# Patient Record
Sex: Male | Born: 1945 | Race: White | Hispanic: No | Marital: Married | Smoking: Current every day smoker
Health system: Southern US, Community
[De-identification: ages and names within clinical notes are randomized; demographics above are authoritative.]

## PROBLEM LIST (undated history)

## (undated) DIAGNOSIS — F319 Bipolar disorder, unspecified: Secondary | ICD-10-CM

## (undated) DIAGNOSIS — K219 Gastro-esophageal reflux disease without esophagitis: Secondary | ICD-10-CM

## (undated) DIAGNOSIS — J449 Chronic obstructive pulmonary disease, unspecified: Secondary | ICD-10-CM

## (undated) DIAGNOSIS — I10 Essential (primary) hypertension: Secondary | ICD-10-CM

## (undated) DIAGNOSIS — M1990 Unspecified osteoarthritis, unspecified site: Secondary | ICD-10-CM

## (undated) DIAGNOSIS — R011 Cardiac murmur, unspecified: Secondary | ICD-10-CM

## (undated) HISTORY — PX: HIP SURGERY: SHX245

---

## 2004-09-28 ENCOUNTER — Inpatient Hospital Stay (HOSPITAL_COMMUNITY): Admission: EM | Admit: 2004-09-28 | Discharge: 2004-09-30 | Payer: Self-pay | Admitting: Emergency Medicine

## 2004-09-28 ENCOUNTER — Ambulatory Visit: Payer: Self-pay | Admitting: Family Medicine

## 2006-04-02 ENCOUNTER — Emergency Department (HOSPITAL_COMMUNITY): Admission: EM | Admit: 2006-04-02 | Discharge: 2006-04-02 | Payer: Self-pay | Admitting: Emergency Medicine

## 2017-01-22 ENCOUNTER — Emergency Department (HOSPITAL_COMMUNITY): Payer: Medicare Other

## 2017-01-22 ENCOUNTER — Inpatient Hospital Stay (HOSPITAL_COMMUNITY): Payer: Medicare Other

## 2017-01-22 ENCOUNTER — Inpatient Hospital Stay (HOSPITAL_COMMUNITY)
Admission: EM | Admit: 2017-01-22 | Discharge: 2017-01-25 | DRG: 389 | Disposition: A | Discharge status: Home / Self Care | Payer: Medicare Other | Attending: Internal Medicine | Admitting: Internal Medicine

## 2017-01-22 ENCOUNTER — Encounter (HOSPITAL_COMMUNITY): Payer: Self-pay | Admitting: Emergency Medicine

## 2017-01-22 DIAGNOSIS — R509 Fever, unspecified: Secondary | ICD-10-CM

## 2017-01-22 DIAGNOSIS — I10 Essential (primary) hypertension: Secondary | ICD-10-CM

## 2017-01-22 DIAGNOSIS — K56600 Partial intestinal obstruction, unspecified as to cause: Principal | ICD-10-CM | POA: Diagnosis present

## 2017-01-22 DIAGNOSIS — E875 Hyperkalemia: Secondary | ICD-10-CM | POA: Diagnosis present

## 2017-01-22 DIAGNOSIS — E869 Volume depletion, unspecified: Secondary | ICD-10-CM | POA: Diagnosis present

## 2017-01-22 DIAGNOSIS — R739 Hyperglycemia, unspecified: Secondary | ICD-10-CM | POA: Diagnosis present

## 2017-01-22 DIAGNOSIS — T39395A Adverse effect of other nonsteroidal anti-inflammatory drugs [NSAID], initial encounter: Secondary | ICD-10-CM | POA: Diagnosis present

## 2017-01-22 DIAGNOSIS — T445X5A Adverse effect of predominantly beta-adrenoreceptor agonists, initial encounter: Secondary | ICD-10-CM | POA: Diagnosis present

## 2017-01-22 DIAGNOSIS — N179 Acute kidney failure, unspecified: Secondary | ICD-10-CM | POA: Diagnosis present

## 2017-01-22 DIAGNOSIS — R109 Unspecified abdominal pain: Secondary | ICD-10-CM

## 2017-01-22 DIAGNOSIS — Z720 Tobacco use: Secondary | ICD-10-CM | POA: Diagnosis present

## 2017-01-22 DIAGNOSIS — E860 Dehydration: Secondary | ICD-10-CM | POA: Diagnosis present

## 2017-01-22 DIAGNOSIS — Z79899 Other long term (current) drug therapy: Secondary | ICD-10-CM

## 2017-01-22 DIAGNOSIS — Z5320 Procedure and treatment not carried out because of patient's decision for unspecified reasons: Secondary | ICD-10-CM | POA: Diagnosis present

## 2017-01-22 DIAGNOSIS — F17200 Nicotine dependence, unspecified, uncomplicated: Secondary | ICD-10-CM | POA: Diagnosis present

## 2017-01-22 DIAGNOSIS — K56609 Unspecified intestinal obstruction, unspecified as to partial versus complete obstruction: Secondary | ICD-10-CM | POA: Diagnosis present

## 2017-01-22 HISTORY — DX: Essential (primary) hypertension: I10

## 2017-01-22 LAB — CBC
HCT: 43.3 % (ref 39.0–52.0)
Hemoglobin: 14.9 g/dL (ref 13.0–17.0)
MCH: 33.8 pg (ref 26.0–34.0)
MCHC: 34.4 g/dL (ref 30.0–36.0)
MCV: 98.2 fL (ref 78.0–100.0)
Platelets: 222 10*3/uL (ref 150–400)
RBC: 4.41 MIL/uL (ref 4.22–5.81)
RDW: 12.9 % (ref 11.5–15.5)
WBC: 9.4 10*3/uL (ref 4.0–10.5)

## 2017-01-22 LAB — URINALYSIS, ROUTINE W REFLEX MICROSCOPIC
Bacteria, UA: NONE SEEN
Glucose, UA: NEGATIVE mg/dL
Hgb urine dipstick: NEGATIVE
Ketones, ur: NEGATIVE mg/dL
Leukocytes, UA: NEGATIVE
Nitrite: NEGATIVE
Protein, ur: 30 mg/dL — AB
Specific Gravity, Urine: 1.026 (ref 1.005–1.030)
pH: 5 (ref 5.0–8.0)

## 2017-01-22 LAB — LIPASE, BLOOD: Lipase: 25 U/L (ref 11–51)

## 2017-01-22 LAB — DIFFERENTIAL
Basophils Absolute: 0 10*3/uL (ref 0.0–0.1)
Basophils Relative: 0 %
Eosinophils Absolute: 0.1 10*3/uL (ref 0.0–0.7)
Eosinophils Relative: 1 %
Lymphocytes Relative: 15 %
Lymphs Abs: 1.3 10*3/uL (ref 0.7–4.0)
Monocytes Absolute: 1.2 10*3/uL — ABNORMAL HIGH (ref 0.1–1.0)
Monocytes Relative: 13 %
Neutro Abs: 6.2 10*3/uL (ref 1.7–7.7)
Neutrophils Relative %: 71 %

## 2017-01-22 LAB — COMPREHENSIVE METABOLIC PANEL
ALT: 24 U/L (ref 17–63)
AST: 39 U/L (ref 15–41)
Albumin: 4.7 g/dL (ref 3.5–5.0)
Alkaline Phosphatase: 59 U/L (ref 38–126)
Anion gap: 13 (ref 5–15)
BUN: 44 mg/dL — ABNORMAL HIGH (ref 6–20)
CO2: 23 mmol/L (ref 22–32)
Calcium: 8.9 mg/dL (ref 8.9–10.3)
Chloride: 101 mmol/L (ref 101–111)
Creatinine, Ser: 2.02 mg/dL — ABNORMAL HIGH (ref 0.61–1.24)
GFR calc Af Amer: 37 mL/min — ABNORMAL LOW (ref >60)
GFR calc non Af Amer: 32 mL/min — ABNORMAL LOW (ref >60)
Glucose, Bld: 127 mg/dL — ABNORMAL HIGH (ref 65–99)
Potassium: 5.6 mmol/L — ABNORMAL HIGH (ref 3.5–5.1)
Sodium: 137 mmol/L (ref 135–145)
Total Bilirubin: 1.7 mg/dL — ABNORMAL HIGH (ref 0.3–1.2)
Total Protein: 7.9 g/dL (ref 6.5–8.1)

## 2017-01-22 LAB — LACTIC ACID, PLASMA: Lactic Acid, Venous: 0.7 mmol/L (ref 0.5–1.9)

## 2017-01-22 LAB — I-STAT CG4 LACTIC ACID, ED: Lactic Acid, Venous: 3.16 mmol/L (ref 0.5–1.9)

## 2017-01-22 MED ORDER — LABETALOL HCL 5 MG/ML IV SOLN
10.0000 mg | INTRAVENOUS | Status: DC | PRN
  Administered 2017-01-23: 10 mg via INTRAVENOUS
  Filled 2017-01-22: qty 4

## 2017-01-22 MED ORDER — HYDROMORPHONE HCL 1 MG/ML IJ SOLN
1.0000 mg | INTRAMUSCULAR | Status: DC | PRN
  Administered 2017-01-22 – 2017-01-23 (×4): 1 mg via INTRAVENOUS
  Filled 2017-01-22 (×4): qty 1

## 2017-01-22 MED ORDER — GABAPENTIN 400 MG PO CAPS
800.0000 mg | ORAL_CAPSULE | Freq: Every day | ORAL | Status: DC
  Administered 2017-01-23 – 2017-01-25 (×3): 800 mg via ORAL
  Filled 2017-01-22 (×3): qty 2

## 2017-01-22 MED ORDER — BENZOCAINE 20 % MT AERO
INHALATION_SPRAY | OROMUCOSAL | Status: AC
  Filled 2017-01-22: qty 57

## 2017-01-22 MED ORDER — HYDROMORPHONE HCL 1 MG/ML IJ SOLN
1.0000 mg | Freq: Once | INTRAMUSCULAR | Status: AC
  Administered 2017-01-22: 1 mg via INTRAVENOUS
  Filled 2017-01-22: qty 1

## 2017-01-22 MED ORDER — SODIUM CHLORIDE 0.9 % IV BOLUS (SEPSIS)
1000.0000 mL | Freq: Once | INTRAVENOUS | Status: AC
  Administered 2017-01-22: 1000 mL via INTRAVENOUS

## 2017-01-22 MED ORDER — IOPAMIDOL (ISOVUE-300) INJECTION 61%
INTRAVENOUS | Status: AC
  Filled 2017-01-22: qty 30

## 2017-01-22 MED ORDER — ONDANSETRON HCL 4 MG/2ML IJ SOLN: 4.0000 mg | Freq: Four times a day (QID) | INTRAMUSCULAR | Status: DC | PRN

## 2017-01-22 MED ORDER — ONDANSETRON HCL 4 MG PO TABS: 4.0000 mg | ORAL_TABLET | Freq: Four times a day (QID) | ORAL | Status: DC | PRN

## 2017-01-22 MED ORDER — SODIUM CHLORIDE 0.9 % IV SOLN: Freq: Once | INTRAVENOUS | Status: DC

## 2017-01-22 MED ORDER — KCL IN DEXTROSE-NACL 20-5-0.9 MEQ/L-%-% IV SOLN
INTRAVENOUS | Status: DC
  Administered 2017-01-22 – 2017-01-24 (×4): via INTRAVENOUS

## 2017-01-22 MED ORDER — PANTOPRAZOLE SODIUM 40 MG PO TBEC
40.0000 mg | DELAYED_RELEASE_TABLET | Freq: Every day | ORAL | Status: DC
  Administered 2017-01-23 – 2017-01-25 (×3): 40 mg via ORAL
  Filled 2017-01-22 (×3): qty 1

## 2017-01-22 MED ORDER — QUETIAPINE FUMARATE 100 MG PO TABS
200.0000 mg | ORAL_TABLET | Freq: Every day | ORAL | Status: DC
Start: 2017-01-22 — End: 2017-01-25
  Administered 2017-01-22 – 2017-01-23 (×2): 200 mg via ORAL
  Administered 2017-01-24: 100 mg via ORAL
  Filled 2017-01-22 (×3): qty 2

## 2017-01-22 MED ORDER — ONDANSETRON HCL 4 MG/2ML IJ SOLN
4.0000 mg | Freq: Once | INTRAMUSCULAR | Status: AC
  Administered 2017-01-22: 4 mg via INTRAVENOUS
  Filled 2017-01-22: qty 2

## 2017-01-22 MED ORDER — AMLODIPINE BESYLATE 5 MG PO TABS
10.0000 mg | ORAL_TABLET | Freq: Every day | ORAL | Status: DC
  Administered 2017-01-23 – 2017-01-25 (×3): 10 mg via ORAL
  Filled 2017-01-22 (×3): qty 2

## 2017-01-22 MED ORDER — HEPARIN SODIUM (PORCINE) 5000 UNIT/ML IJ SOLN
5000.0000 [IU] | Freq: Three times a day (TID) | INTRAMUSCULAR | Status: DC
  Administered 2017-01-22 – 2017-01-25 (×7): 5000 [IU] via SUBCUTANEOUS
  Filled 2017-01-22 (×9): qty 1

## 2017-01-22 NOTE — H&P (Signed)
History and Physical    Gabriel Henderson WUJ:[id number] DOB: 05/10/1946 DOA: 01/22/2017  PCP: No PCP Per Patient  Patient coming from: Home.    Chief Complaint:  Abdominal pain.   HPI: Gabriel Henderson is an 70 y.o. 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 Work up in the ER showed elevated BUN/Cr of 44 and 2.2, normal WBC, normal electrolytes, normal LFT and normal lipase.  An abdominal pelvic CT showed SBO with transitional point.  He denied ever diagnosed with IBD and specifically never had crohn's disease.  He had a colonsocpy last year, and reportedly was negative.  EDP spoke with Dr Lovell Sheehan, and hospitalist was asked to admit him for SBO.  He has a "virgin belly".    ED Course:  See above.  Rewiew of Systems:  Constitutional: Negative for malaise, fever and chills. No significant weight loss or weight gain Eyes: Negative for eye pain, redness and discharge, diplopia, visual changes, or flashes of light. ENMT: Negative for ear pain, hoarseness, nasal congestion, sinus pressure and sore throat. No headaches; tinnitus, drooling, or problem swallowing. Cardiovascular: Negative for chest pain, palpitations, diaphoresis, dyspnea and peripheral edema. ; No orthopnea, PND Respiratory: Negative for cough, hemoptysis, wheezing and stridor. No pleuritic chestpain. Gastrointestinal: Negative for diarrhea, constipation,  melena, blood in stool, hematemesis, jaundice and rectal bleeding.    Genitourinary: Negative for frequency, dysuria, incontinence,flank pain and hematuria; Musculoskeletal: Negative for back pain and neck pain. Negative for swelling and trauma.;  Skin: . Negative for pruritus, rash, abrasions, bruising and skin lesion.; ulcerations Neuro: Negative for headache, lightheadedness and neck stiffness. Negative for weakness, altered level of consciousness , altered mental status,  extremity weakness, burning feet, involuntary movement, seizure and syncope.  Psych: negative for anxiety, depression, insomnia, tearfulness, panic attacks, hallucinations, paranoia, suicidal or homicidal ideation    Past Medical History:  Diagnosis Date  . Hypertension    History reviewed. No pertinent surgical history.   reports that he has been smoking.  He does not have any smokeless tobacco history on file. He reports that he does not drink alcohol or use drugs.  No Known Allergies  History reviewed. No pertinent family history.   Prior to Admission medications   Medication Sig Start Date End Date Taking? Authorizing Provider  amLODipine (NORVASC) 10 MG tablet Take 10 mg by mouth daily.   Yes Historical Provider, MD  diclofenac (CATAFLAM) 50 MG tablet Take 50 mg by mouth 2 (two) times daily.   Yes Historical Provider, MD  gabapentin (NEURONTIN) 400 MG capsule Take 800 mg by mouth daily.   Yes Historical Provider, MD  lisinopril (PRINIVIL,ZESTRIL) 20 MG tablet Take 10 mg by mouth daily.   Yes Historical Provider, MD  omeprazole (PRILOSEC) 20 MG capsule Take 40 mg by mouth 2 (two) times daily before a meal.   Yes Historical Provider, MD  QUEtiapine (SEROQUEL) 200 MG tablet Take 200 mg by mouth at bedtime.   Yes Historical Provider, MD  sulfaSALAzine (AZULFIDINE) 500 MG EC tablet Take 500 mg by mouth 2 (two) times daily.   Yes Historical Provider, MD    Physical Exam: Vitals:   01/22/17 1516 01/22/17 1517 01/22/17 1606 01/22/17 1630  BP: (!) 151/115  144/77 130/78  Pulse: 104  106 102  Resp: 20   18  Temp: 98 F (36.7 C)     TempSrc: Oral  SpO2: 98%  96% 93%  Weight:  74.8 kg (165 lb)    Height:  6\' 3"  (1.905 m)        Constitutional: NAD, calm, comfortable Vitals:   01/22/17 1516 01/22/17 1517 01/22/17 1606 01/22/17 1630  BP: (!) 151/115  144/77 130/78  Pulse: 104  106 102  Resp: 20   18  Temp: 98 F (36.7 C)     TempSrc: Oral     SpO2: 98%  96% 93%    Weight:  74.8 kg (165 lb)    Height:  6\' 3"  (1.905 m)     Eyes: PERRL, lids and conjunctivae normal ENMT: Mucous membranes are moist. Posterior pharynx clear of any exudate or lesions.Normal dentition.  Neck: normal, supple, no masses, no thyromegaly Respiratory: clear to auscultation bilaterally, no wheezing, no crackles. Normal respiratory effort. No accessory muscle use.  Cardiovascular: Regular rate and rhythm, no murmurs / rubs / gallops. No extremity edema. 2+ pedal pulses. No carotid bruits.  Abdomen: diffuse tenderness, no rebound. Slightly distended with hypertympay. no masses palpated. No hepatosplenomegaly. Bowel sounds positive.  Musculoskeletal: no clubbing / cyanosis. No joint deformity upper and lower extremities. Good ROM, no contractures. Normal muscle tone.  Skin: no rashes, lesions, ulcers. No induration Neurologic: CN 2-12 grossly intact. Sensation intact, DTR normal. Strength 5/5 in all 4.  Psychiatric: Normal judgment and insight. Alert and oriented x 3. Normal mood.     Labs on Admission: I have personally reviewed following labs and imaging studies  CBC:  Recent Labs Lab 01/22/17 1555  WBC 9.4  NEUTROABS 6.2  HGB 14.9  HCT 43.3  MCV 98.2  PLT 222   Basic Metabolic Panel:  Recent Labs Lab 01/22/17 1555  NA 137  K 5.6*  CL 101  CO2 23  GLUCOSE 127*  BUN 44*  CREATININE 2.02*  CALCIUM 8.9   GFR: Estimated Creatinine Clearance: 37 mL/min (by C-G formula based on SCr of 2.02 mg/dL (H)). Liver Function Tests:  Recent Labs Lab 01/22/17 1555  AST 39  ALT 24  ALKPHOS 59  BILITOT 1.7*  PROT 7.9  ALBUMIN 4.7    Recent Labs Lab 01/22/17 1555  LIPASE 25   Urine analysis:    Component Value Date/Time   COLORURINE AMBER (A) 01/22/2017 1715   APPEARANCEUR HAZY (A) 01/22/2017 1715   LABSPEC 1.026 01/22/2017 1715   PHURINE 5.0 01/22/2017 1715   GLUCOSEU NEGATIVE 01/22/2017 1715   HGBUR NEGATIVE 01/22/2017 1715   BILIRUBINUR SMALL (A)  01/22/2017 1715   KETONESUR NEGATIVE 01/22/2017 1715   PROTEINUR 30 (A) 01/22/2017 1715   NITRITE NEGATIVE 01/22/2017 1715   LEUKOCYTESUR NEGATIVE 01/22/2017 1715    Radiological Exams on Admission: Ct Abdomen Pelvis Wo Contrast  Result Date: 01/22/2017 CLINICAL DATA:  Abdominal pain.  Nausea and vomiting. EXAM: CT ABDOMEN AND PELVIS WITHOUT CONTRAST TECHNIQUE: Multidetector CT imaging of the abdomen and pelvis was performed following the standard protocol without IV contrast. COMPARISON:  September 28, 2004 FINDINGS: Lower chest: Dependent atelectasis. Coronary artery calcifications. No other abnormalities. Hepatobiliary: No focal liver abnormality is seen. No gallstones, gallbladder wall thickening, or biliary dilatation. Pancreas: Unremarkable. No pancreatic ductal dilatation or surrounding inflammatory changes. Spleen: Normal in size without focal abnormality. Adrenals/Urinary Tract: Adrenal glands are unremarkable. Kidneys are normal, without renal calculi, focal lesion, or hydronephrosis. Bladder is unremarkable. Stomach/Bowel: The stomach is normal. There is a small bowel obstruction. The transition point is thought to be in the right lateral lower abdomen in the  region of mid to distal ileum. 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 The colon is not completely decompressed but is smaller than small bowel. Visualized appendix is unremarkable. Vascular/Lymphatic: Calcified atherosclerosis is seen in the non aneurysmal aorta and branching vessels. No adenopathy. Reproductive: Prostate is unremarkable. Other: No abdominal wall hernia or abnormality. No abdominopelvic ascites. Musculoskeletal: No acute or significant osseous findings. IMPRESSION: 1. Small bowel obstruction. The transition point is thought to be in the mid to distal ileum within the lateral right lower abdomen. 2. Atherosclerosis. Electronically Signed   By: Gerome Sam III M.D   On: 01/22/2017 18:55   Dg Abd  Portable 1 View  Result Date: 01/22/2017 CLINICAL DATA:  Abdominal pain.  Nausea and vomiting. EXAM: PORTABLE ABDOMEN - 1 VIEW COMPARISON:  09/28/2004 CT abdomen/pelvis. FINDINGS: Motion degraded radiograph. There are several dilated small bowel loops in the visualized upper abdomen measuring up to 4.1 cm diameter. No evidence of pneumatosis or pneumoperitoneum. Grossly clear lung bases. No pathologic soft tissue calcifications. IMPRESSION: Limited motion degraded radiograph. No free air. Dilated small bowel loops in the visualized upper abdomen, cannot exclude a mid / distal small bowel obstruction. Correlate with CT abdomen/pelvis with oral IV contrast as clinically warranted. Electronically Signed   By: Delbert Phenix M.D.   On: 01/22/2017 16:34    Assessment/Plan Principal Problem:   SBO (small bowel obstruction) Active Problems:   AKI (acute kidney injury) (HCC)   Volume depletion   Tobacco abuse   HTN (hypertension)    PLAN:     SBO:  Will benefit having NGT placed.  Make NPO except for ice chips and water with meds.  Await further recommendation from surgery.  IV Dilaudid.  Hold Suboxone for now.   AKI:  Suspect volume depletion.  Will give IVF.  Hold ACE I due to elevated Cr, to increase GFR.  Follow Cr in the am.  Tobacco abuse:  Advised quit.  HTN:  Will continue norvasc.  Can use IV Labetelol PRN for severe HTN.   Hold ACE due to AKI.   DVT prophylaxis: Sub Q Heparin.  Code Status: FULL CODE.  Family Communication: wife at bedside.  Disposition Plan: home when better.  Consults called: Surgery, Dr Lovell Sheehan.  Admission status: Inpatient.    Suha Schoenbeck MD FACP. [facility]  If 7PM-7AM, please contact night-coverage [email or web address] Password [facility]  01/22/2017, 8:23 PM

## 2017-01-22 NOTE — ED Provider Notes (Signed)
[facility]-EMERGENCY DEPT Provider Note   CSN: [id number] Arrival date & time: 01/22/17  1511     History   Chief Complaint Chief Complaint  Patient presents with  . Abdominal Pain    HPI Gabriel Henderson is a 71 y.o. male.  HPI 71 year old male who presents with abdominal pain. History of HTN. No prior abdominal surgeries. Onset of gradually worsening abdominal pain one day ago. Pain generalized, 6/10 in severity currently. Associated with multiple episodes of nausea and vomiting, nonbloody and nonbilious. Normal BM yesterday. No fever, diarrhea, dysuria, urinary frequency, chest pain or dyspnea. Seen at urgent care this morning and sent to ED for evaluation. Past Medical History:  Diagnosis Date  . Hypertension     There are no active problems to display for this patient.   History reviewed. No pertinent surgical history.     Home Medications    Prior to Admission medications   Medication Sig Start Date End Date Taking? Authorizing Provider  amLODipine (NORVASC) 10 MG tablet Take 10 mg by mouth daily.   Yes Historical Provider, MD  diclofenac (CATAFLAM) 50 MG tablet Take 50 mg by mouth 2 (two) times daily.   Yes Historical Provider, MD  gabapentin (NEURONTIN) 400 MG capsule Take 800 mg by mouth daily.   Yes Historical Provider, MD  lisinopril (PRINIVIL,ZESTRIL) 20 MG tablet Take 10 mg by mouth daily.   Yes Historical Provider, MD  omeprazole (PRILOSEC) 20 MG capsule Take 40 mg by mouth 2 (two) times daily before a meal.   Yes Historical Provider, MD  QUEtiapine (SEROQUEL) 200 MG tablet Take 200 mg by mouth at bedtime.   Yes Historical Provider, MD  sulfaSALAzine (AZULFIDINE) 500 MG EC tablet Take 500 mg by mouth 2 (two) times daily.   Yes Historical Provider, MD    Family History History reviewed. No pertinent family history.  Social History Social History  Substance Use Topics  . Smoking status: Current Some Day Smoker  . Smokeless tobacco: Not on file  . Alcohol use  No     Allergies   Patient has no known allergies.   Review of Systems Review of Systems 10/14 systems reviewed and are negative other than those stated in the HPI   Physical Exam Updated Vital Signs BP 130/78   Pulse 102   Temp 98 F (36.7 C) (Oral)   Resp 18   Ht 6\' 3"  (1.905 m)   Wt 165 lb (74.8 kg)   SpO2 93%   BMI 20.62 kg/m   Physical Exam Physical Exam  Nursing note and vitals reviewed. Constitutional: looks unwell, in distress secondary to pain  Head: Normocephalic and atraumatic.  Mouth/Throat: Oropharynx is clear and moist.  Neck: Normal range of motion. Neck supple.  Cardiovascular: tachycardic rate and regular rhythm.   Pulmonary/Chest: Effort normal and breath sounds normal.  Abdominal: Soft. Moderate distension. Diffuse tenderness. Guarding with reported rebound tenderness. Musculoskeletal: No deformities Neurological: Alert, no facial droop, fluent speech, moves all extremities symmetrically Skin: Skin is warm and dry.  Psychiatric: Cooperative   ED Treatments / Results  Labs (all labs ordered are listed, but only abnormal results are displayed) Labs Reviewed  COMPREHENSIVE METABOLIC PANEL - Abnormal; Notable for the following:       Result Value   Potassium 5.6 (*)    Glucose, Bld 127 (*)    BUN 44 (*)    Creatinine, Ser 2.02 (*)    Total Bilirubin 1.7 (*)    GFR calc non  Af Amer 32 (*)    GFR calc Af Amer 37 (*)    All other components within normal limits  URINALYSIS, ROUTINE W REFLEX MICROSCOPIC - Abnormal; Notable for the following:    Color, Urine AMBER (*)    APPearance HAZY (*)    Bilirubin Urine SMALL (*)    Protein, ur 30 (*)    All other components within normal limits  I-STAT CG4 LACTIC ACID, ED - Abnormal; Notable for the following:    Lactic Acid, Venous 3.16 (*)    All other components within normal limits  LIPASE, BLOOD  CBC  LACTIC ACID, PLASMA  DIFFERENTIAL    EKG  EKG Interpretation None        Radiology Ct Abdomen Pelvis Wo Contrast  Result Date: 01/22/2017 CLINICAL DATA:  Abdominal pain.  Nausea and vomiting. EXAM: CT ABDOMEN AND PELVIS WITHOUT CONTRAST TECHNIQUE: Multidetector CT imaging of the abdomen and pelvis was performed following the standard protocol without IV contrast. COMPARISON:  September 28, 2004 FINDINGS: Lower chest: Dependent atelectasis. Coronary artery calcifications. No other abnormalities. Hepatobiliary: No focal liver abnormality is seen. No gallstones, gallbladder wall thickening, or biliary dilatation. Pancreas: Unremarkable. No pancreatic ductal dilatation or surrounding inflammatory changes. Spleen: Normal in size without focal abnormality. Adrenals/Urinary Tract: Adrenal glands are unremarkable. Kidneys are normal, without renal calculi, focal lesion, or hydronephrosis. Bladder is unremarkable. Stomach/Bowel: The stomach is normal. There is a small bowel obstruction. The transition point is thought to be in the right lateral lower abdomen in the region of mid to distal ileum. 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 The colon is not completely decompressed but is smaller than small bowel. Visualized appendix is unremarkable. Vascular/Lymphatic: Calcified atherosclerosis is seen in the non aneurysmal aorta and branching vessels. No adenopathy. Reproductive: Prostate is unremarkable. Other: No abdominal wall hernia or abnormality. No abdominopelvic ascites. Musculoskeletal: No acute or significant osseous findings. IMPRESSION: 1. Small bowel obstruction. The transition point is thought to be in the mid to distal ileum within the lateral right lower abdomen. 2. Atherosclerosis. Electronically Signed   By: Gerome Sam III M.D   On: 01/22/2017 18:55   Dg Abd Portable 1 View  Result Date: 01/22/2017 CLINICAL DATA:  Abdominal pain.  Nausea and vomiting. EXAM: PORTABLE ABDOMEN - 1 VIEW COMPARISON:  09/28/2004 CT abdomen/pelvis. FINDINGS: Motion  degraded radiograph. There are several dilated small bowel loops in the visualized upper abdomen measuring up to 4.1 cm diameter. No evidence of pneumatosis or pneumoperitoneum. Grossly clear lung bases. No pathologic soft tissue calcifications. IMPRESSION: Limited motion degraded radiograph. No free air. Dilated small bowel loops in the visualized upper abdomen, cannot exclude a mid / distal small bowel obstruction. Correlate with CT abdomen/pelvis with oral IV contrast as clinically warranted. Electronically Signed   By: Delbert Phenix M.D.   On: 01/22/2017 16:34    Procedures Procedures (including critical care time)  Medications Ordered in ED Medications  iopamidol (ISOVUE-300) 61 % injection (not administered)  0.9 %  sodium chloride infusion (not administered)  sodium chloride 0.9 % bolus 1,000 mL (1,000 mLs Intravenous New Bag/Given 01/22/17 1604)  HYDROmorphone (DILAUDID) injection 1 mg (1 mg Intravenous Given 01/22/17 1602)  ondansetron (ZOFRAN) injection 4 mg (4 mg Intravenous Given 01/22/17 1602)  sodium chloride 0.9 % bolus 1,000 mL (1,000 mLs Intravenous New Bag/Given 01/22/17 1713)  HYDROmorphone (DILAUDID) injection 1 mg (1 mg Intravenous Given 01/22/17 1713)     Initial Impression / Assessment and Plan / ED Course  I have reviewed the triage vital signs and the nursing notes.  Pertinent labs & imaging results that were available during my care of the patient were reviewed by me and considered in my medical decision making (see chart for details).    Presenting with abdominal pain nausea and vomiting over past 1-2 days. Appears very uncomfortable. Abdomen distended and he complains of exquisite tenderness. Bedside XR without free air, but w/ distended loops of small bowel c/f obstruction. Elevated lactate but nromal WBC. Likely related to vomiting and dehydration. CT visualized and shows SBO with transition point in RLQ. Will place in NG tube. Discussed with Dr. Lovell Sheehan who will consult.  Admitted to Dr. Conley Rolls  Final Clinical Impressions(s) / ED Diagnoses   Final diagnoses:  Small bowel obstruction    New Prescriptions New Prescriptions   No medications on file     Lavera Guise, MD 01/22/17 [phone]

## 2017-01-22 NOTE — ED Triage Notes (Signed)
Pt reports generalized abd pain with n/v x2days, denies diarrhea.  Pt alert and oriented.

## 2017-01-22 NOTE — ED Notes (Signed)
CRITICAL VALUE ALERT  Critical value received:  Lactic Acid 3.16  Date of notification:  01/22/17  Time of notification:  1630  Critical value read back:Yes.    Nurse who received alert:  RMinter, RN  MD notified (1st page):  Dr. Verdie MosherLiu  Time of first page:  1630  MD notified (2nd page):  Time of second page:  Responding MD:  Dr. Verdie MosherLiu  Time MD responded:  1630

## 2017-01-23 ENCOUNTER — Encounter (HOSPITAL_COMMUNITY): Payer: Self-pay

## 2017-01-23 LAB — TSH: TSH: 0.464 u[IU]/mL (ref 0.350–4.500)

## 2017-01-23 MED ORDER — HYDROMORPHONE HCL 1 MG/ML IJ SOLN
1.0000 mg | INTRAMUSCULAR | Status: DC | PRN
  Administered 2017-01-23 – 2017-01-25 (×9): 1 mg via INTRAVENOUS
  Filled 2017-01-23 (×9): qty 1

## 2017-01-23 NOTE — Progress Notes (Signed)
Pt's only complaint this AM is 6/10 ABD pain, relieved by Dilaudid. No nausea or vomiting.  Previous shift reported 4 BMs overnight with 2 being very large.  Pt still refusing NG tube.

## 2017-01-23 NOTE — Progress Notes (Signed)
Patient ID: Gabriel Henderson, male   DOB: [birth year], 71 y.o.   MRN: [id number]                                                                PROGRESS NOTE                                                                                                                                                                                                             Patient Demographics:    Gabriel Henderson, is a 71 y.o. male, DOB - 07-06-1946, NFA:[id number]  Admit date - 01/22/2017   Admitting Physician Houston Siren, MD  Outpatient Primary MD for the patient is No PCP Per Patient  LOS - 1  Outpatient Specialists:     Chief Complaint  Patient presents with  . Abdominal Pain       Brief Narrative   71 y.o. 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 Work up in the ER showed elevated BUN/Cr of 44 and 2.2, normal WBC, normal electrolytes, normal LFT and normal lipase.  An abdominal pelvic CT showed SBO with transitional point.  He denied ever diagnosed with IBD and specifically never had crohn's disease.  He had a colonsocpy last year, and reportedly was negative.  EDP spoke with Dr Lovell Sheehan, and hospitalist was asked to admit him for SBO.  He has a "virgin belly".    Subjective:    Gabriel Henderson today still has abdominal discomfort.  pt apparently couldn't tolerate NGT.  He is refusing another.  Pt apparently has had  3-5 bm this am.  Denies fever, chills, n/v, diarrhea, brbpr,    Assessment  & Plan :    Principal Problem:   SBO (small bowel obstruction) Active Problems:   AKI (acute kidney injury) (HCC)   Volume depletion   Tobacco abuse   HTN (hypertension)   1. SBO NPO Except for ice chips and sips with medications NGT to low intermittent suction (pt refusing) Appreciate surgery input  2. ARF Hydrate with ns iv Holding ACE I Check cmp tomorrow  3. Hyperkalemia Bmp pending  4.  Hypertension Cont norvasc  5. Hyperglycemia Check hga1c  6. Nicotine dependence Pt  counselled on smoking cessation x .    Code Status :  FULL CODE  Family Communication  : w patient  Disposition Plan  :  home  Barriers For Discharge :   Consults  :  surgery  Procedures  :  NGT 2/3  DVT Prophylaxis  :  Heparin -  SCDs   Lab Results  Component Value Date   PLT 222 01/22/2017    Antibiotics  :    Anti-infectives    None        Objective:   Vitals:   01/22/17 1630 01/22/17 2000 01/22/17 2115 01/22/17 2300  BP: 130/78 118/70  139/78  Pulse: 102 90  78  Resp: 18  26 20   Temp:    98 F (36.7 C)  TempSrc:    Oral  SpO2: 93% 92%  98%  Weight:    75 kg (165 lb 4.8 oz)  Height:        Wt Readings from Last 3 Encounters:  01/22/17 75 kg (165 lb 4.8 oz)     Intake/Output Summary (Last 24 hours) at 01/23/17 0536 Last data filed at 01/22/17 2347  Gross per 24 hour  Intake               60 ml  Output                0 ml  Net               60 ml     Physical Exam  Awake Alert, Oriented X 3, No new F.N deficits, Normal affect [place].AT,PERRAL Supple Neck,No JVD, No cervical lymphadenopathy appriciated.  Symmetrical Chest wall movement, Good air movement bilaterally, CTAB RRR,No Gallops,Rubs or new Murmurs, No Parasternal Heave Soft, slight generalized tenderness. No guarding, no rebound, + bs normoactive.   Ext: No Cyanosis, Clubbing or edema, No new Rash or bruise      Data Review:    CBC  Recent Labs Lab 01/22/17 1555  WBC 9.4  HGB 14.9  HCT 43.3  PLT 222  MCV 98.2  MCH 33.8  MCHC 34.4  RDW 12.9  LYMPHSABS 1.3  MONOABS 1.2*  EOSABS 0.1  BASOSABS 0.0    Chemistries   Recent Labs Lab 01/22/17 1555  NA 137  K 5.6*  CL 101  CO2 23  GLUCOSE 127*  BUN 44*  CREATININE 2.02*  CALCIUM 8.9  AST 39  ALT 24  ALKPHOS 59  BILITOT 1.7*    ------------------------------------------------------------------------------------------------------------------ No results for input(s): CHOL, HDL, LDLCALC, TRIG, CHOLHDL, LDLDIRECT in the last 72 hours.  No results found for: HGBA1C ------------------------------------------------------------------------------------------------------------------  Recent Labs  01/22/17 1555  TSH 0.464   ------------------------------------------------------------------------------------------------------------------ No results for input(s): VITAMINB12, FOLATE, FERRITIN, TIBC, IRON, RETICCTPCT in the last 72 hours.  Coagulation profile No results for input(s): INR, PROTIME in the last 168 hours.  No results for input(s): DDIMER in the last 72 hours.  Cardiac Enzymes No results for input(s): CKMB, TROPONINI, MYOGLOBIN in the last 168 hours.  Invalid input(s): CK ------------------------------------------------------------------------------------------------------------------ No results found for: BNP  Inpatient Medications  Scheduled Meds: . amLODipine  10 mg Oral Daily  . Benzocaine      . gabapentin  800 mg Oral Daily  . heparin  5,000 Units Subcutaneous Q8H  . pantoprazole  40 mg Oral Daily  . QUEtiapine  200 mg Oral QHS   Continuous Infusions: . dextrose 5 % and 0.9 % NaCl with KCl 20 mEq/L 100 mL/hr at 01/22/17  2347   PRN Meds:.HYDROmorphone (DILAUDID) injection, labetalol, ondansetron **OR** ondansetron (ZOFRAN) IV  Micro Results No results found for this or any previous visit (from the past 240 hour(s)).  Radiology Reports Ct Abdomen Pelvis Wo Contrast  Result Date: 01/22/2017 CLINICAL DATA:  Abdominal pain.  Nausea and vomiting. EXAM: CT ABDOMEN AND PELVIS WITHOUT CONTRAST TECHNIQUE: Multidetector CT imaging of the abdomen and pelvis was performed following the standard protocol without IV contrast. COMPARISON:  September 28, 2004 FINDINGS: Lower chest: Dependent atelectasis.  Coronary artery calcifications. No other abnormalities. Hepatobiliary: No focal liver abnormality is seen. No gallstones, gallbladder wall thickening, or biliary dilatation. Pancreas: Unremarkable. No pancreatic ductal dilatation or surrounding inflammatory changes. Spleen: Normal in size without focal abnormality. Adrenals/Urinary Tract: Adrenal glands are unremarkable. Kidneys are normal, without renal calculi, focal lesion, or hydronephrosis. Bladder is unremarkable. Stomach/Bowel: The stomach is normal. There is a small bowel obstruction. The transition point is thought to be in the right lateral lower abdomen in the region of mid to distal ileum. 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 The colon is not completely decompressed but is smaller than small bowel. Visualized appendix is unremarkable. Vascular/Lymphatic: Calcified atherosclerosis is seen in the non aneurysmal aorta and branching vessels. No adenopathy. Reproductive: Prostate is unremarkable. Other: No abdominal wall hernia or abnormality. No abdominopelvic ascites. Musculoskeletal: No acute or significant osseous findings. IMPRESSION: 1. Small bowel obstruction. The transition point is thought to be in the mid to distal ileum within the lateral right lower abdomen. 2. Atherosclerosis. Electronically Signed   By: Gerome Samavid  Williams III M.D   On: 01/22/2017 18:55   Dg Abd Portable 1 View  Result Date: 01/22/2017 CLINICAL DATA:  Abdominal pain.  Nausea and vomiting. EXAM: PORTABLE ABDOMEN - 1 VIEW COMPARISON:  09/28/2004 CT abdomen/pelvis. FINDINGS: Motion degraded radiograph. There are several dilated small bowel loops in the visualized upper abdomen measuring up to 4.1 cm diameter. No evidence of pneumatosis or pneumoperitoneum. Grossly clear lung bases. No pathologic soft tissue calcifications. IMPRESSION: Limited motion degraded radiograph. No free air. Dilated small bowel loops in the visualized upper abdomen, cannot exclude a  mid / distal small bowel obstruction. Correlate with CT abdomen/pelvis with oral IV contrast as clinically warranted. Electronically Signed   By: Delbert PhenixJason A Poff M.D.   On: 01/22/2017 16:34    Time Spent in minutes  30   Pearson GrippeJames Lisl Slingerland M.D on 01/23/2017 at 5:36 AM  Between 7am to 7pm - Pager - [phone] After 7pm go to [email or web address] - password [email or web address]  [facility] -  Office  [phone]

## 2017-01-23 NOTE — Consult Note (Signed)
Reason for Consult: Small bowel obstruction Referring Physician: Dr. [name] is an 71 y.o. male.  HPI: Patient is a 71 year old white male who presented to the emergency room with a four-day history of worsening abdominal cramping. He did have some nausea, but no specific vomiting. In the emergency room, he was found to have an elevated lactic acid level as well as renal insufficiency. Abdominal CT scan revealed a small bowel obstruction with transition point. He has never had abdominal surgery. He denies a history of irritable bowel disease. He had a colonoscopy within the last year which was reportedly negative. He was admitted to the hospital for further evaluation treatment. Otherwise been in the hospital, he has had 5 bowel movements. No nausea or vomiting have been noted. This morning, he does have some abdominal discomfort, but he states he was in the bathroom all evening. His abdominal pain is significantly decreased. He has no fever or chills. He is hungry. He has no nausea.  Past Medical History:  Diagnosis Date  . Hypertension     History reviewed. No pertinent surgical history.  History reviewed. No pertinent family history.  Social History:  reports that he has been smoking.  He does not have any smokeless tobacco history on file. He reports that he does not drink alcohol or use drugs.  Allergies: No Known Allergies  Medications:  Prior to Admission:  Prescriptions Prior to Admission  Medication Sig Dispense Refill Last Dose  . amLODipine (NORVASC) 10 MG tablet Take 10 mg by mouth daily.   01/21/2017 at Unknown time  . diclofenac (CATAFLAM) 50 MG tablet Take 50 mg by mouth 2 (two) times daily.   01/21/2017 at Unknown time  . gabapentin (NEURONTIN) 400 MG capsule Take 800 mg by mouth daily.   01/22/2017 at Unknown time  . lisinopril (PRINIVIL,ZESTRIL) 20 MG tablet Take 10 mg by mouth daily.   01/21/2017 at Unknown time  . omeprazole (PRILOSEC) 20 MG capsule Take 40 mg by  mouth 2 (two) times daily before a meal.   01/21/2017 at Unknown time  . QUEtiapine (SEROQUEL) 200 MG tablet Take 200 mg by mouth at bedtime.   01/21/2017 at Unknown time  . sulfaSALAzine (AZULFIDINE) 500 MG EC tablet Take 500 mg by mouth 2 (two) times daily.   01/21/2017 at Unknown time   Scheduled: . amLODipine  10 mg Oral Daily  . gabapentin  800 mg Oral Daily  . heparin  5,000 Units Subcutaneous Q8H  . pantoprazole  40 mg Oral Daily  . QUEtiapine  200 mg Oral QHS    Results for orders placed or performed during the hospital encounter of 01/22/17 (from the past 48 hour(s))  Lipase, blood     Status: None   Collection Time: 01/22/17  3:55 PM  Result Value Ref Range   Lipase 25 11 - 51 U/L  Comprehensive metabolic panel     Status: Abnormal   Collection Time: 01/22/17  3:55 PM  Result Value Ref Range   Sodium 137 135 - 145 mmol/L   Potassium 5.6 (H) 3.5 - 5.1 mmol/L    Comment: SLIGHT HEMOLYSIS   Chloride 101 101 - 111 mmol/L   CO2 23 22 - 32 mmol/L   Glucose, Bld 127 (H) 65 - 99 mg/dL   BUN 44 (H) 6 - 20 mg/dL   Creatinine, Ser 2.02 (H) 0.61 - 1.24 mg/dL   Calcium 8.9 8.9 - 10.3 mg/dL   Total Protein 7.9 6.5 - 8.1  g/dL   Albumin 4.7 3.5 - 5.0 g/dL   AST 39 15 - 41 U/L   ALT 24 17 - 63 U/L   Alkaline Phosphatase 59 38 - 126 U/L   Total Bilirubin 1.7 (H) 0.3 - 1.2 mg/dL   GFR calc non Af Amer 32 (L) >60 mL/min   GFR calc Af Amer 37 (L) >60 mL/min    Comment: (NOTE) The eGFR has been calculated using the CKD EPI equation. This calculation has not been validated in all clinical situations. eGFR's persistently <60 mL/min signify possible Chronic Kidney Disease.    Anion gap 13 5 - 15  CBC     Status: None   Collection Time: 01/22/17  3:55 PM  Result Value Ref Range   WBC 9.4 4.0 - 10.5 K/uL   RBC 4.41 4.22 - 5.81 MIL/uL   Hemoglobin 14.9 13.0 - 17.0 g/dL   HCT 43.3 39.0 - 52.0 %   MCV 98.2 78.0 - 100.0 fL   MCH 33.8 26.0 - 34.0 pg   MCHC 34.4 30.0 - 36.0 g/dL   RDW 12.9  11.5 - 15.5 %   Platelets 222 150 - 400 K/uL  Differential     Status: Abnormal   Collection Time: 01/22/17  3:55 PM  Result Value Ref Range   Neutrophils Relative % 71 %   Neutro Abs 6.2 1.7 - 7.7 K/uL   Lymphocytes Relative 15 %   Lymphs Abs 1.3 0.7 - 4.0 K/uL   Monocytes Relative 13 %   Monocytes Absolute 1.2 (H) 0.1 - 1.0 K/uL   Eosinophils Relative 1 %   Eosinophils Absolute 0.1 0.0 - 0.7 K/uL   Basophils Relative 0 %   Basophils Absolute 0.0 0.0 - 0.1 K/uL  TSH     Status: None   Collection Time: 01/22/17  3:55 PM  Result Value Ref Range   TSH 0.464 0.350 - 4.500 uIU/mL    Comment: Performed by a 3rd Generation assay with a functional sensitivity of <=0.01 uIU/mL.  I-Stat CG4 Lactic Acid, ED     Status: Abnormal   Collection Time: 01/22/17  4:11 PM  Result Value Ref Range   Lactic Acid, Venous 3.16 (HH) 0.5 - 1.9 mmol/L   Comment NOTIFIED PHYSICIAN   Urinalysis, Routine w reflex microscopic     Status: Abnormal   Collection Time: 01/22/17  5:15 PM  Result Value Ref Range   Color, Urine AMBER (A) YELLOW    Comment: BIOCHEMICALS MAY BE AFFECTED BY COLOR   APPearance HAZY (A) CLEAR   Specific Gravity, Urine 1.026 1.005 - 1.030   pH 5.0 5.0 - 8.0   Glucose, UA NEGATIVE NEGATIVE mg/dL   Hgb urine dipstick NEGATIVE NEGATIVE   Bilirubin Urine SMALL (A) NEGATIVE   Ketones, ur NEGATIVE NEGATIVE mg/dL   Protein, ur 30 (A) NEGATIVE mg/dL   Nitrite NEGATIVE NEGATIVE   Leukocytes, UA NEGATIVE NEGATIVE   RBC / HPF 0-5 0 - 5 RBC/hpf   WBC, UA 0-5 0 - 5 WBC/hpf   Bacteria, UA NONE SEEN NONE SEEN   Mucous PRESENT    Hyaline Casts, UA PRESENT    Granular Casts, UA PRESENT   Lactic acid, plasma     Status: None   Collection Time: 01/22/17  6:42 PM  Result Value Ref Range   Lactic Acid, Venous 0.7 0.5 - 1.9 mmol/L    Ct Abdomen Pelvis Wo Contrast  Result Date: 01/22/2017 CLINICAL DATA:  Abdominal pain.  Nausea and vomiting. EXAM:  CT ABDOMEN AND PELVIS WITHOUT CONTRAST  TECHNIQUE: Multidetector CT imaging of the abdomen and pelvis was performed following the standard protocol without IV contrast. COMPARISON:  September 28, 2004 FINDINGS: Lower chest: Dependent atelectasis. Coronary artery calcifications. No other abnormalities. Hepatobiliary: No focal liver abnormality is seen. No gallstones, gallbladder wall thickening, or biliary dilatation. Pancreas: Unremarkable. No pancreatic ductal dilatation or surrounding inflammatory changes. Spleen: Normal in size without focal abnormality. Adrenals/Urinary Tract: Adrenal glands are unremarkable. Kidneys are normal, without renal calculi, focal lesion, or hydronephrosis. Bladder is unremarkable. Stomach/Bowel: The stomach is normal. There is a small bowel obstruction. The transition point is thought to be in the right lateral lower abdomen in the region of mid to distal ileum. 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 The colon is not completely decompressed but is smaller than small bowel. Visualized appendix is unremarkable. Vascular/Lymphatic: Calcified atherosclerosis is seen in the non aneurysmal aorta and branching vessels. No adenopathy. Reproductive: Prostate is unremarkable. Other: No abdominal wall hernia or abnormality. No abdominopelvic ascites. Musculoskeletal: No acute or significant osseous findings. IMPRESSION: 1. Small bowel obstruction. The transition point is thought to be in the mid to distal ileum within the lateral right lower abdomen. 2. Atherosclerosis. Electronically Signed   By: Dorise Bullion III M.D   On: 01/22/2017 18:55   Dg Abd Portable 1 View  Result Date: 01/22/2017 CLINICAL DATA:  Abdominal pain.  Nausea and vomiting. EXAM: PORTABLE ABDOMEN - 1 VIEW COMPARISON:  09/28/2004 CT abdomen/pelvis. FINDINGS: Motion degraded radiograph. There are several dilated small bowel loops in the visualized upper abdomen measuring up to 4.1 cm diameter. No evidence of pneumatosis or pneumoperitoneum.  Grossly clear lung bases. No pathologic soft tissue calcifications. IMPRESSION: Limited motion degraded radiograph. No free air. Dilated small bowel loops in the visualized upper abdomen, cannot exclude a mid / distal small bowel obstruction. Correlate with CT abdomen/pelvis with oral IV contrast as clinically warranted. Electronically Signed   By: Ilona Sorrel M.D.   On: 01/22/2017 16:34    ROS:  Pertinent items noted in HPI and remainder of comprehensive ROS otherwise negative.  Blood pressure 111/66, pulse 69, temperature 98.5 F (36.9 C), temperature source Oral, resp. rate 20, height '6\' 3"'$  (1.905 m), weight 75 kg (165 lb 4.8 oz), SpO2 95 %. Physical Exam: Pleasant well-developed and well-nourished white male in no acute distress. Head is normocephalic, atraumatic. Neck is supple without JVD. Lungs clear auscultation with breath sounds bilaterally. Heart examination reveals a regular rate and rhythm without S3, S4, murmurs. Abdomen soft, nontender, nondistended. No hepatosplenomegaly, masses, hernias identified. Extremity examination reveals bilateral femoral pulses. CT scan images reviewed. History and physical examination reviewed.  Assessment/Plan: Impression: Clinically, the patient does not have a small bowel obstruction. His renal insufficiency is resolving. No need for acute surgical intervention. Will advance diet as tolerated. Will follow with you.  Neligh A 01/23/2017, 10:24 AM

## 2017-01-24 ENCOUNTER — Inpatient Hospital Stay (HOSPITAL_COMMUNITY): Payer: Medicare Other

## 2017-01-24 DIAGNOSIS — R509 Fever, unspecified: Secondary | ICD-10-CM

## 2017-01-24 LAB — COMPREHENSIVE METABOLIC PANEL
ALBUMIN: 3.3 g/dL — AB (ref 3.5–5.0)
ALK PHOS: 40 U/L (ref 38–126)
ALT: 12 U/L — ABNORMAL LOW (ref 17–63)
ANION GAP: 8 (ref 5–15)
AST: 17 U/L (ref 15–41)
BILIRUBIN TOTAL: 0.6 mg/dL (ref 0.3–1.2)
BUN: 18 mg/dL (ref 6–20)
CALCIUM: 8.3 mg/dL — AB (ref 8.9–10.3)
CO2: 20 mmol/L — AB (ref 22–32)
CREATININE: 0.93 mg/dL (ref 0.61–1.24)
Chloride: 107 mmol/L (ref 101–111)
GFR calc Af Amer: >60 mL/min (ref >60)
GFR calc non Af Amer: >60 mL/min (ref >60)
GLUCOSE: 124 mg/dL — AB (ref 65–99)
Potassium: 3.5 mmol/L (ref 3.5–5.1)
SODIUM: 135 mmol/L (ref 135–145)
TOTAL PROTEIN: 5.8 g/dL — AB (ref 6.5–8.1)

## 2017-01-24 LAB — CBC
HEMATOCRIT: 32.9 % — AB (ref 39.0–52.0)
HEMOGLOBIN: 11 g/dL — AB (ref 13.0–17.0)
MCH: 32.6 pg (ref 26.0–34.0)
MCHC: 33.4 g/dL (ref 30.0–36.0)
MCV: 97.6 fL (ref 78.0–100.0)
Platelets: 133 10*3/uL — ABNORMAL LOW (ref 150–400)
RBC: 3.37 MIL/uL — AB (ref 4.22–5.81)
RDW: 12.3 % (ref 11.5–15.5)
WBC: 4.8 10*3/uL (ref 4.0–10.5)

## 2017-01-24 LAB — INFLUENZA PANEL BY PCR (TYPE A & B)
INFLAPCR: NEGATIVE
INFLBPCR: NEGATIVE

## 2017-01-24 MED ORDER — PIPERACILLIN-TAZOBACTAM 3.375 G IVPB
3.3750 g | Freq: Three times a day (TID) | INTRAVENOUS | Status: DC
  Administered 2017-01-24 – 2017-01-25 (×3): 3.375 g via INTRAVENOUS
  Filled 2017-01-24 (×2): qty 50

## 2017-01-24 MED ORDER — ACETAMINOPHEN 325 MG PO TABS
650.0000 mg | ORAL_TABLET | ORAL | Status: DC | PRN
  Administered 2017-01-24: 650 mg via ORAL
  Filled 2017-01-24: qty 2

## 2017-01-24 NOTE — Progress Notes (Signed)
Pt has a temperature of 103.1, Dr. Conley RollsLe notified.

## 2017-01-24 NOTE — Progress Notes (Signed)
Patient ID: Eliot FordDonald E Burtch, male   DOB: 11/16/1946, 71 y.o.   MRN: [id number]                                                                PROGRESS NOTE                                                                                                                                                                                                             Patient Demographics:    Gabriel ShiverDonald Fossum, is a 71 y.o. male, DOB - 11/16/1946, ZHY:[id number]  Admit date - 01/22/2017   Admitting Physician Houston SirenPeter Le, MD  Outpatient Primary MD for the patient is No PCP Per Patient  LOS - 2  Outpatient Specialists:   Chief Complaint  Patient presents with  . 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 Work up in the ER showed elevated BUN/Cr of 44 and 2.2, normal WBC, normal electrolytes, normal LFT and normal lipase. An abdominal pelvic CT showed SBO with transitional point. He denied ever diagnosed with IBD and specifically never had crohn's disease. He had a colonsocpy last year, and reportedly was negative. EDP spoke with Dr Lovell SheehanJenkins, and hospitalist was asked to admit him for SBO. He has a "virgin belly".    Subjective:    Gabriel Henderson today has fever overnite.  Otherwise abdomen is doing well.  small bm yesterday, passing gas.  Denies cough, cp, palp, sob, diarrhea, dysuria, hematuria.    Assessment  & Plan :    Principal Problem:   SBO (small bowel obstruction) Active Problems:   AKI (acute kidney injury) (HCC)   Volume depletion   Tobacco abuse   HTN (hypertension)   1.  Fever, w/up pending.   2. SBO Advancing diet as tolerated,  Clinically improving,  Pt refused ngt,  Surgery following , appreciate input.  Abdominal xray today  3. ARF Cont Hydrate with ns iv Holding ACE I Check cmp tomorrow  4. Hyperkalemia   resolved  5. Hypertension Cont norvasc  6. Hyperglycemia Check hga1c  7. Nicotine dependence Pt counselled on smoking cessation x 3minutes.  Code Status :  FULL CODE  Family Communication  : w patient  Disposition Plan  :  home  Barriers For Discharge :   Consults  :  surgery  Procedures  :  NGT 2/3  DVT Prophylaxis  :  Heparin -  SCDs    Lab Results  Component Value Date   PLT 133 (L) 01/24/2017    Antibiotics  :    Anti-infectives    None        Objective:   Vitals:   01/23/17 0600 01/23/17 1300 01/23/17 2130 01/24/17 0636  BP: 111/66 115/61 140/74 140/69  Pulse: 69 71 73 (!) 114  Resp: 20 20 20 20   Temp: 98.5 F (36.9 C) 98.8 F (37.1 C) 99 F (37.2 C) (!) 103.1 F (39.5 C)  TempSrc: Oral Oral Oral Oral  SpO2: 95% 98% 98% 97%  Weight:      Height:        Wt Readings from Last 3 Encounters:  01/22/17 75 kg (165 lb 4.8 oz)     Intake/Output Summary (Last 24 hours) at 01/24/17 0852 Last data filed at 01/24/17 0500  Gross per 24 hour  Intake             2680 ml  Output              300 ml  Net             2380 ml     Physical Exam  Awake Alert, Oriented X 3, No new F.N deficits, Normal affect [place].AT,PERRAL Supple Neck,No JVD, No cervical lymphadenopathy appriciated.  Symmetrical Chest wall movement, Good air movement bilaterally, CTAB RRR,No Gallops,Rubs or new Murmurs, No Parasternal Heave +ve B.Sounds, Abd Soft, No tenderness, No organomegaly appriciated, No rebound - guarding or rigidity. No Cyanosis, Clubbing or edema, No new Rash or bruise     Data Review:    CBC  Recent Labs Lab 01/22/17 1555 01/24/17 0717  WBC 9.4 4.8  HGB 14.9 11.0*  HCT 43.3 32.9*  PLT 222 133*  MCV 98.2 97.6  MCH 33.8 32.6  MCHC 34.4 33.4  RDW 12.9 12.3  LYMPHSABS 1.3  --   MONOABS 1.2*  --   EOSABS 0.1  --   BASOSABS 0.0  --     Chemistries   Recent Labs Lab 01/22/17 1555 01/24/17 0717  NA 137 135  K 5.6* 3.5  CL  101 107  CO2 23 20*  GLUCOSE 127* 124*  BUN 44* 18  CREATININE 2.02* 0.93  CALCIUM 8.9 8.3*  AST 39 17  ALT 24 12*  ALKPHOS 59 40  BILITOT 1.7* 0.6   ------------------------------------------------------------------------------------------------------------------ No results for input(s): CHOL, HDL, LDLCALC, TRIG, CHOLHDL, LDLDIRECT in the last 72 hours.  No results found for: HGBA1C ------------------------------------------------------------------------------------------------------------------  Recent Labs  01/22/17 1555  TSH 0.464   ------------------------------------------------------------------------------------------------------------------ No results for input(s): VITAMINB12, FOLATE, FERRITIN, TIBC, IRON, RETICCTPCT in the last 72 hours.  Coagulation profile No results for input(s): INR, PROTIME in the last 168 hours.  No results for input(s): DDIMER in the last 72 hours.  Cardiac Enzymes No results for input(s): CKMB, TROPONINI, MYOGLOBIN in the last 168 hours.  Invalid input(s): CK ------------------------------------------------------------------------------------------------------------------ No results found for: BNP  Inpatient Medications  Scheduled Meds: . amLODipine  10 mg Oral Daily  . gabapentin  800 mg Oral Daily  . heparin  5,000 Units Subcutaneous Q8H  . pantoprazole  40 mg Oral Daily  . QUEtiapine  200 mg  Oral QHS   Continuous Infusions: . dextrose 5 % and 0.9 % NaCl with KCl 20 mEq/L 100 mL/hr at 01/23/17 1928   PRN Meds:.acetaminophen, HYDROmorphone (DILAUDID) injection, labetalol, ondansetron **OR** ondansetron (ZOFRAN) IV  Micro Results Recent Results (from the past 240 hour(s))  Culture, blood (Routine X 2) w Reflex to ID Panel     Status: None (Preliminary result)   Collection Time: 01/24/17  7:17 AM  Result Value Ref Range Status   Specimen Description BLOOD RIGHT ARM  Final   Special Requests BOTTLES DRAWN AEROBIC AND ANAEROBIC  6CC  Final   Culture NO GROWTH <12 HOURS  Final   Report Status PENDING  Incomplete  Culture, blood (Routine X 2) w Reflex to ID Panel     Status: None (Preliminary result)   Collection Time: 01/24/17  7:25 AM  Result Value Ref Range Status   Specimen Description BLOOD LEFT HAND  Final   Special Requests BOTTLES DRAWN AEROBIC AND ANAEROBIC 6CC  Final   Culture NO GROWTH <12 HOURS  Final   Report Status PENDING  Incomplete    Radiology Reports Ct Abdomen Pelvis Wo Contrast  Result Date: 01/22/2017 CLINICAL DATA:  Abdominal pain.  Nausea and vomiting. EXAM: CT ABDOMEN AND PELVIS WITHOUT CONTRAST TECHNIQUE: Multidetector CT imaging of the abdomen and pelvis was performed following the standard protocol without IV contrast. COMPARISON:  September 28, 2004 FINDINGS: Lower chest: Dependent atelectasis. Coronary artery calcifications. No other abnormalities. Hepatobiliary: No focal liver abnormality is seen. No gallstones, gallbladder wall thickening, or biliary dilatation. Pancreas: Unremarkable. No pancreatic ductal dilatation or surrounding inflammatory changes. Spleen: Normal in size without focal abnormality. Adrenals/Urinary Tract: Adrenal glands are unremarkable. Kidneys are normal, without renal calculi, focal lesion, or hydronephrosis. Bladder is unremarkable. Stomach/Bowel: The stomach is normal. There is a small bowel obstruction. The transition point is thought to be in the right lateral lower abdomen in the region of mid to distal ileum. 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 The colon is not completely decompressed but is smaller than small bowel. Visualized appendix is unremarkable. Vascular/Lymphatic: Calcified atherosclerosis is seen in the non aneurysmal aorta and branching vessels. No adenopathy. Reproductive: Prostate is unremarkable. Other: No abdominal wall hernia or abnormality. No abdominopelvic ascites. Musculoskeletal: No acute or significant osseous findings.  IMPRESSION: 1. Small bowel obstruction. The transition point is thought to be in the mid to distal ileum within the lateral right lower abdomen. 2. Atherosclerosis. Electronically Signed   By: Gerome Sam III M.D   On: 01/22/2017 18:55   Dg Abd Portable 1 View  Result Date: 01/22/2017 CLINICAL DATA:  Abdominal pain.  Nausea and vomiting. EXAM: PORTABLE ABDOMEN - 1 VIEW COMPARISON:  09/28/2004 CT abdomen/pelvis. FINDINGS: Motion degraded radiograph. There are several dilated small bowel loops in the visualized upper abdomen measuring up to 4.1 cm diameter. No evidence of pneumatosis or pneumoperitoneum. Grossly clear lung bases. No pathologic soft tissue calcifications. IMPRESSION: Limited motion degraded radiograph. No free air. Dilated small bowel loops in the visualized upper abdomen, cannot exclude a mid / distal small bowel obstruction. Correlate with CT abdomen/pelvis with oral IV contrast as clinically warranted. Electronically Signed   By: Delbert Phenix M.D.   On: 01/22/2017 16:34    Time Spent in minutes  30   Pearson Grippe M.D on 01/24/2017 at 8:52 AM  Between 7am to 7pm - Pager - [phone]  After 7pm go to [email or web address] - password [facility]  [facility] -  Office  [phone]

## 2017-01-24 NOTE — Progress Notes (Signed)
RN called requesting antipyretic for temp of 103.1. This is his first temp spike since I admitted him for SBO. Will need full work up including BC, UA, CXR and flu swab.  Will place on precaution. No antibiotic was started at this time.  Daytime rounder will follow up on these.    Thanks,  Houston SirenPeter Kazmir Oki MD FACP.  Hospitalist.

## 2017-01-24 NOTE — Progress Notes (Signed)
Subjective: Patient had the sweats last night. He denies any abdominal pain, nausea, vomiting, or diarrhea. He feels fine currently. He is hungry and wants his diet advanced.  Objective: Vital signs in last 24 hours: Temp:  [98.8 F (37.1 C)-103.1 F (39.5 C)] 103.1 F (39.5 C) (02/05 0636) Pulse Rate:  [71-114] 114 (02/05 0636) Resp:  [20] 20 (02/05 0636) BP: (115-140)/(61-74) 140/69 (02/05 0636) SpO2:  [97 %-98 %] 97 % (02/05 0636) Last BM Date: 01/23/17  Intake/Output from previous day: 02/04 0701 - 02/05 0700 In: 2680 [P.O.:480; I.V.:2200] Out: 300 [Urine:300] Intake/Output this shift: No intake/output data recorded.  General appearance: alert, cooperative and no distress GI: soft, non-tender; bowel sounds normal; no masses,  no organomegaly  Lab Results:   Recent Labs  01/22/17 1555 01/24/17 0717  WBC 9.4 4.8  HGB 14.9 11.0*  HCT 43.3 32.9*  PLT 222 133*   BMET  Recent Labs  01/22/17 1555 01/24/17 0717  NA 137 135  K 5.6* 3.5  CL 101 107  CO2 23 20*  GLUCOSE 127* 124*  BUN 44* 18  CREATININE 2.02* 0.93  CALCIUM 8.9 8.3*   PT/INR No results for input(s): LABPROT, INR in the last 72 hours.  Studies/Results: Ct Abdomen Pelvis Wo Contrast  Result Date: 01/22/2017 CLINICAL DATA:  Abdominal pain.  Nausea and vomiting. EXAM: CT ABDOMEN AND PELVIS WITHOUT CONTRAST TECHNIQUE: Multidetector CT imaging of the abdomen and pelvis was performed following the standard protocol without IV contrast. COMPARISON:  September 28, 2004 FINDINGS: Lower chest: Dependent atelectasis. Coronary artery calcifications. No other abnormalities. Hepatobiliary: No focal liver abnormality is seen. No gallstones, gallbladder wall thickening, or biliary dilatation. Pancreas: Unremarkable. No pancreatic ductal dilatation or surrounding inflammatory changes. Spleen: Normal in size without focal abnormality. Adrenals/Urinary Tract: Adrenal glands are unremarkable. Kidneys are normal, without  renal calculi, focal lesion, or hydronephrosis. Bladder is unremarkable. Stomach/Bowel: The stomach is normal. There is a small bowel obstruction. The transition point is thought to be in the right lateral lower abdomen in the region of mid to distal ileum. 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 The colon is not completely decompressed but is smaller than small bowel. Visualized appendix is unremarkable. Vascular/Lymphatic: Calcified atherosclerosis is seen in the non aneurysmal aorta and branching vessels. No adenopathy. Reproductive: Prostate is unremarkable. Other: No abdominal wall hernia or abnormality. No abdominopelvic ascites. Musculoskeletal: No acute or significant osseous findings. IMPRESSION: 1. Small bowel obstruction. The transition point is thought to be in the mid to distal ileum within the lateral right lower abdomen. 2. Atherosclerosis. Electronically Signed   By: Gerome Samavid  Williams III M.D   On: 01/22/2017 18:55   Dg Abd Portable 1 View  Result Date: 01/22/2017 CLINICAL DATA:  Abdominal pain.  Nausea and vomiting. EXAM: PORTABLE ABDOMEN - 1 VIEW COMPARISON:  09/28/2004 CT abdomen/pelvis. FINDINGS: Motion degraded radiograph. There are several dilated small bowel loops in the visualized upper abdomen measuring up to 4.1 cm diameter. No evidence of pneumatosis or pneumoperitoneum. Grossly clear lung bases. No pathologic soft tissue calcifications. IMPRESSION: Limited motion degraded radiograph. No free air. Dilated small bowel loops in the visualized upper abdomen, cannot exclude a mid / distal small bowel obstruction. Correlate with CT abdomen/pelvis with oral IV contrast as clinically warranted. Electronically Signed   By: Delbert PhenixJason A Poff M.D.   On: 01/22/2017 16:34    Anti-infectives: Anti-infectives    None      Assessment/Plan: Impression: Small bowel obstruction, resolved. He does not appear to have  any intra-abdominal issues at this time. I doubt the fever is  related to this. Fever workup pending. Will advance his diet.  LOS: 2 days    Rylie Knierim A 01/24/2017

## 2017-01-25 DIAGNOSIS — K56609 Unspecified intestinal obstruction, unspecified as to partial versus complete obstruction: Secondary | ICD-10-CM

## 2017-01-25 DIAGNOSIS — N179 Acute kidney failure, unspecified: Secondary | ICD-10-CM

## 2017-01-25 DIAGNOSIS — Z720 Tobacco use: Secondary | ICD-10-CM

## 2017-01-25 DIAGNOSIS — I10 Essential (primary) hypertension: Secondary | ICD-10-CM

## 2017-01-25 LAB — HEMOGLOBIN A1C
HEMOGLOBIN A1C: 5.1 % (ref 4.8–5.6)
MEAN PLASMA GLUCOSE: 100 mg/dL

## 2017-01-25 NOTE — Discharge Summary (Signed)
Physician Discharge Summary  Gabriel Henderson WJX:[id number] DOB: December 28, 1945 DOA: 01/22/2017  PCP: No PCP Per Patient  Admit date: 01/22/2017 Discharge date: 01/25/2017  Admitted From: home Disposition:  home  Recommendations for Outpatient Follow-up:  1. Follow up with PCP in 1-2 weeks 2. 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 Work up in the ER showed elevated BUN/Cr of 44 and 2.2, normal WBC, normal electrolytes, normal LFT and normal lipase. An abdominal pelvic CT showed SBO with transitional point. He denied ever diagnosed with IBD and specifically never had crohn's disease. He had a colonsocpy last year, and reportedly was negative. EDP spoke with Dr Lovell Sheehan, and hospitalist was asked to admit him for SBO.  Discharge Diagnoses:  Principal Problem:   SBO (small bowel obstruction) Active Problems:   AKI (acute kidney injury) (HCC)   Volume depletion   Tobacco abuse   HTN (hypertension)   Fever  Patient was initially kept NPO in the hospital. He refused NG tube placement. He was followed by general surgery and with bowel rest/hydration, his SBO began to improve. Patient began to have bowel movements and diet was slowly advanced. He is tolerating a regular diet now and is not having any vomiting. He is having regular bowel movements. He reports having regular bowel movements at home and does not want any laxatives on discharge.  AKI due to dehydration and concurrent ACEi/NSAID use. Creatinine normalized with IV hydration. He has been restarted on ACEi. NSAIDs have been discontinued for now. He will need repeat BMET in 1 week and if creatinine is stable, can consider restarting NSAIDs.  Discharge  Instructions  Discharge Instructions    Diet - low sodium heart healthy    Complete by:  As directed    Increase activity slowly    Complete by:  As directed      Allergies as of 01/25/2017   No Known Allergies     Medication List    STOP taking these medications   diclofenac 50 MG tablet Commonly known as:  CATAFLAM     TAKE these medications   amLODipine 10 MG tablet Commonly known as:  NORVASC Take 10 mg by mouth daily.   gabapentin 400 MG capsule Commonly known as:  NEURONTIN Take 800 mg by mouth daily.   lisinopril 20 MG tablet Commonly known as:  PRINIVIL,ZESTRIL Take 10 mg by mouth daily.   omeprazole 20 MG capsule Commonly known as:  PRILOSEC Take 40 mg by mouth 2 (two) times daily before a meal.   QUEtiapine 200 MG tablet Commonly known as:  SEROQUEL Take 200 mg by mouth at bedtime.   sulfaSALAzine 500 MG EC tablet Commonly known as:  AZULFIDINE Take 500 mg by mouth 2 (two) times daily.      Follow-up Information    Wallace Cullens, MD Follow up.   Specialty:  Family Medicine Why:  call office to make appointment for 1 week Contact information: [facility] [address] [place] [address] [phone]          No Known Allergies  Consultations:  Gen. surgery   Procedures/Studies: Ct Abdomen Pelvis Wo Contrast  Result Date: 01/22/2017 CLINICAL DATA:  Abdominal pain.  Nausea and vomiting. EXAM: CT ABDOMEN AND PELVIS WITHOUT  CONTRAST TECHNIQUE: Multidetector CT imaging of the abdomen and pelvis was performed following the standard protocol without IV contrast. COMPARISON:  September 28, 2004 FINDINGS: Lower chest: Dependent atelectasis. Coronary artery calcifications. No other abnormalities. Hepatobiliary: No focal liver abnormality is seen. No gallstones, gallbladder wall thickening, or biliary dilatation. Pancreas: Unremarkable. No pancreatic ductal dilatation or surrounding inflammatory changes. Spleen: Normal in size without focal  abnormality. Adrenals/Urinary Tract: Adrenal glands are unremarkable. Kidneys are normal, without renal calculi, focal lesion, or hydronephrosis. Bladder is unremarkable. Stomach/Bowel: The stomach is normal. There is a small bowel obstruction. The transition point is thought to be in the right lateral lower abdomen in the region of mid to distal ileum. 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 The colon is not completely decompressed but is smaller than small bowel. Visualized appendix is unremarkable. Vascular/Lymphatic: Calcified atherosclerosis is seen in the non aneurysmal aorta and branching vessels. No adenopathy. Reproductive: Prostate is unremarkable. Other: No abdominal wall hernia or abnormality. No abdominopelvic ascites. Musculoskeletal: No acute or significant osseous findings. IMPRESSION: 1. Small bowel obstruction. The transition point is thought to be in the mid to distal ileum within the lateral right lower abdomen. 2. Atherosclerosis. Electronically Signed   By: Gerome Samavid  Williams III M.D   On: 01/22/2017 18:55   Dg Chest 2 View  Result Date: 01/24/2017 CLINICAL DATA:  Fever, small bowel obstruction. EXAM: CHEST  2 VIEW COMPARISON:  PA and lateral chest x-ray of September 27, 2014 and radiograph of the lower chest and upper abdomen dated January 22, 2017. FINDINGS: There has been interval development of subsegmental atelectasis at both bases. There is no pleural effusion or alveolar infiltrate. The heart and pulmonary vascularity are normal. The mediastinum is normal in width. The trachea is midline. There are mildly distended gas-filled small and large bowel loops in the upper abdomen. IMPRESSION: Interval development of subsegmental atelectasis at the lung bases. No CHF or alveolar pneumonia. Electronically Signed   By: David  [place] M.D.   On: 01/24/2017 09:46   Dg Abd 2 Views  Result Date: 01/24/2017 CLINICAL DATA:  Fever, small bowel obstruction. EXAM: ABDOMEN - 2 VIEW  COMPARISON:  CT scan of the abdomen pelvis of January 22, 2017 FINDINGS: There remain loops of moderately distended gas and fluid-filled small bowel. There is some gas in the transverse colon and there is contrast in the descending colon and rectosigmoid. No free extraluminal gas collections are observed. The bony structures are unremarkable. IMPRESSION: Persistent partial small bowel obstruction. There has been distal migration of oral contrast into the left colon. There is no evidence of perforation. Electronically Signed   By: David  [place] M.D.   On: 01/24/2017 09:47   Dg Abd Portable 1 View  Result Date: 01/22/2017 CLINICAL DATA:  Abdominal pain.  Nausea and vomiting. EXAM: PORTABLE ABDOMEN - 1 VIEW COMPARISON:  09/28/2004 CT abdomen/pelvis. FINDINGS: Motion degraded radiograph. There are several dilated small bowel loops in the visualized upper abdomen measuring up to 4.1 cm diameter. No evidence of pneumatosis or pneumoperitoneum. Grossly clear lung bases. No pathologic soft tissue calcifications. IMPRESSION: Limited motion degraded radiograph. No free air. Dilated small bowel loops in the visualized upper abdomen, cannot exclude a mid / distal small bowel obstruction. Correlate with CT abdomen/pelvis with oral IV contrast as clinically warranted. Electronically Signed   By: Delbert PhenixJason A Poff M.D.   On: 01/22/2017 16:34       Subjective: Patient has had several bowel movements. He is having a regular diet without any  nausea or vomiting. Wants to go home.  Discharge Exam: Vitals:   01/24/17 2234 01/25/17 0353  BP: 118/70 120/68  Pulse: 84 88  Resp: 20 20  Temp: 98.2 F (36.8 C) 98.2 F (36.8 C)   Vitals:   01/24/17 1513 01/24/17 1932 01/24/17 2234 01/25/17 0353  BP: 116/66  118/70 120/68  Pulse: 83  84 88  Resp: 20  20 20   Temp: 98.7 F (37.1 C)  98.2 F (36.8 C) 98.2 F (36.8 C)  TempSrc:   Oral Oral  SpO2: 97% 97% 98% 97%  Weight:      Height:        General: Pt is alert,  awake, not in acute distress Cardiovascular: RRR, S1/S2 +, no rubs, no gallops Respiratory: CTA bilaterally, no wheezing, no rhonchi Abdominal: Soft, NT, ND, bowel sounds + Extremities: no edema, no cyanosis    The results of significant diagnostics from this hospitalization (including imaging, microbiology, ancillary and laboratory) are listed below for reference.     Microbiology: Recent Results (from the past 240 hour(s))  Culture, blood (Routine X 2) w Reflex to ID Panel     Status: None (Preliminary result)   Collection Time: 01/24/17  7:17 AM  Result Value Ref Range Status   Specimen Description BLOOD RIGHT ARM  Final   Special Requests BOTTLES DRAWN AEROBIC AND ANAEROBIC 6CC  Final   Culture NO GROWTH 1 DAY  Final   Report Status PENDING  Incomplete  Culture, blood (Routine X 2) w Reflex to ID Panel     Status: None (Preliminary result)   Collection Time: 01/24/17  7:25 AM  Result Value Ref Range Status   Specimen Description BLOOD LEFT HAND  Final   Special Requests BOTTLES DRAWN AEROBIC AND ANAEROBIC 6CC  Final   Culture NO GROWTH 1 DAY  Final   Report Status PENDING  Incomplete     Labs: BNP (last 3 results) No results for input(s): BNP in the last 8760 hours. Basic Metabolic Panel:  Recent Labs Lab 01/22/17 1555 01/24/17 0717  NA 137 135  K 5.6* 3.5  CL 101 107  CO2 23 20*  GLUCOSE 127* 124*  BUN 44* 18  CREATININE 2.02* 0.93  CALCIUM 8.9 8.3*   Liver Function Tests:  Recent Labs Lab 01/22/17 1555 01/24/17 0717  AST 39 17  ALT 24 12*  ALKPHOS 59 40  BILITOT 1.7* 0.6  PROT 7.9 5.8*  ALBUMIN 4.7 3.3*    Recent Labs Lab 01/22/17 1555  LIPASE 25   No results for input(s): AMMONIA in the last 168 hours. CBC:  Recent Labs Lab 01/22/17 1555 01/24/17 0717  WBC 9.4 4.8  NEUTROABS 6.2  --   HGB 14.9 11.0*  HCT 43.3 32.9*  MCV 98.2 97.6  PLT 222 133*   Cardiac Enzymes: No results for input(s): CKTOTAL, CKMB, CKMBINDEX, TROPONINI in the  last 168 hours. BNP: Invalid input(s): POCBNP CBG: No results for input(s): GLUCAP in the last 168 hours. D-Dimer No results for input(s): DDIMER in the last 72 hours. Hgb A1c  Recent Labs  01/24/17 0717  HGBA1C 5.1   Lipid Profile No results for input(s): CHOL, HDL, LDLCALC, TRIG, CHOLHDL, LDLDIRECT in the last 72 hours. Thyroid function studies  Recent Labs  01/22/17 1555  TSH 0.464   Anemia work up No results for input(s): VITAMINB12, FOLATE, FERRITIN, TIBC, IRON, RETICCTPCT in the last 72 hours. Urinalysis    Component Value Date/Time   COLORURINE AMBER (A) 01/22/2017 1715  APPEARANCEUR HAZY (A) 01/22/2017 1715   LABSPEC 1.026 01/22/2017 1715   PHURINE 5.0 01/22/2017 1715   GLUCOSEU NEGATIVE 01/22/2017 1715   HGBUR NEGATIVE 01/22/2017 1715   BILIRUBINUR SMALL (A) 01/22/2017 1715   KETONESUR NEGATIVE 01/22/2017 1715   PROTEINUR 30 (A) 01/22/2017 1715   NITRITE NEGATIVE 01/22/2017 1715   LEUKOCYTESUR NEGATIVE 01/22/2017 1715   Sepsis Labs Invalid input(s): PROCALCITONIN,  WBC,  LACTICIDVEN Microbiology Recent Results (from the past 240 hour(s))  Culture, blood (Routine X 2) w Reflex to ID Panel     Status: None (Preliminary result)   Collection Time: 01/24/17  7:17 AM  Result Value Ref Range Status   Specimen Description BLOOD RIGHT ARM  Final   Special Requests BOTTLES DRAWN AEROBIC AND ANAEROBIC 6CC  Final   Culture NO GROWTH 1 DAY  Final   Report Status PENDING  Incomplete  Culture, blood (Routine X 2) w Reflex to ID Panel     Status: None (Preliminary result)   Collection Time: 01/24/17  7:25 AM  Result Value Ref Range Status   Specimen Description BLOOD LEFT HAND  Final   Special Requests BOTTLES DRAWN AEROBIC AND ANAEROBIC 6CC  Final   Culture NO GROWTH 1 DAY  Final   Report Status PENDING  Incomplete     Time coordinating discharge:  SIGNED:   Don Giarrusso, MD  [facility] 01/25/2017, 1:08 PM Pager   If 7PM-7AM, please  contact night-coverage [email or web address] Password [email or web address]

## 2017-01-25 NOTE — Progress Notes (Signed)
Discharged with instructions given on medications,and follow up visits,patient verbalized understanding. No c/o pain or discomfort noted. Staff accompanied patient to awaiting vehicle.

## 2017-01-25 NOTE — Progress Notes (Signed)
  Subjective: Patient ambulating in room. Denies any abdominal pain. Has had multiple bowel movements over the past 24 hours.  Objective: Vital signs in last 24 hours: Temp:  [98.2 F (36.8 C)-98.7 F (37.1 C)] 98.2 F (36.8 C) (02/06 0353) Pulse Rate:  [83-88] 88 (02/06 0353) Resp:  [20] 20 (02/06 0353) BP: (116-120)/(66-70) 120/68 (02/06 0353) SpO2:  [97 %-98 %] 97 % (02/06 0353) Last BM Date: 01/24/17  Intake/Output from previous day: 02/05 0701 - 02/06 0700 In: 2533 [P.O.:480; I.V.:1903; IV Piggyback:100] Out: 1400 [Urine:1400] Intake/Output this shift: No intake/output data recorded.  General appearance: alert, cooperative and no distress GI: soft, non-tender; bowel sounds normal; no masses,  no organomegaly  Lab Results:   Recent Labs  01/22/17 1555 01/24/17 0717  WBC 9.4 4.8  HGB 14.9 11.0*  HCT 43.3 32.9*  PLT 222 133*   BMET  Recent Labs  01/22/17 1555 01/24/17 0717  NA 137 135  K 5.6* 3.5  CL 101 107  CO2 23 20*  GLUCOSE 127* 124*  BUN 44* 18  CREATININE 2.02* 0.93  CALCIUM 8.9 8.3*   PT/INR No results for input(s): LABPROT, INR in the last 72 hours.  Studies/Results: Dg Chest 2 View  Result Date: 01/24/2017 CLINICAL DATA:  Fever, small bowel obstruction. EXAM: CHEST  2 VIEW COMPARISON:  PA and lateral chest x-ray of September 27, 2014 and radiograph of the lower chest and upper abdomen dated January 22, 2017. FINDINGS: There has been interval development of subsegmental atelectasis at both bases. There is no pleural effusion or alveolar infiltrate. The heart and pulmonary vascularity are normal. The mediastinum is normal in width. The trachea is midline. There are mildly distended gas-filled small and large bowel loops in the upper abdomen. IMPRESSION: Interval development of subsegmental atelectasis at the lung bases. No CHF or alveolar pneumonia. Electronically Signed   By: David  [place] M.D.   On: 01/24/2017 09:46   Dg Abd 2 Views  Result Date:  01/24/2017 CLINICAL DATA:  Fever, small bowel obstruction. EXAM: ABDOMEN - 2 VIEW COMPARISON:  CT scan of the abdomen pelvis of January 22, 2017 FINDINGS: There remain loops of moderately distended gas and fluid-filled small bowel. There is some gas in the transverse colon and there is contrast in the descending colon and rectosigmoid. No free extraluminal gas collections are observed. The bony structures are unremarkable. IMPRESSION: Persistent partial small bowel obstruction. There has been distal migration of oral contrast into the left colon. There is no evidence of perforation. Electronically Signed   By: David  [place] M.D.   On: 01/24/2017 09:47    Anti-infectives: Anti-infectives    Start     Dose/Rate Route Frequency Ordered Stop   01/24/17 0915  piperacillin-tazobactam (ZOSYN) IVPB 3.375 g     3.375 g 12.5 mL/hr over 240 Minutes Intravenous Every 8 hours 01/24/17 0856        Assessment/Plan: Impression: Small bowel obstruction resolved. KUB shows contrast within the colon. No acute surgical intervention warranted. Okay for discharge from surgery standpoint.  LOS: 3 days    Gabriel Henderson A 01/25/2017

## 2017-02-01 LAB — CULTURE, BLOOD (ROUTINE X 2)
CULTURE: NO GROWTH
CULTURE: NO GROWTH

## 2022-04-22 ENCOUNTER — Emergency Department (HOSPITAL_COMMUNITY): Payer: Medicare Other

## 2022-04-22 ENCOUNTER — Other Ambulatory Visit: Payer: Self-pay

## 2022-04-22 ENCOUNTER — Observation Stay (HOSPITAL_COMMUNITY)
Admission: EM | Admit: 2022-04-22 | Discharge: 2022-04-23 | Discharge status: Against Medical Advice | Payer: Medicare Other | Attending: Family Medicine | Admitting: Family Medicine

## 2022-04-22 ENCOUNTER — Encounter (HOSPITAL_COMMUNITY): Payer: Self-pay

## 2022-04-22 DIAGNOSIS — R718 Other abnormality of red blood cells: Secondary | ICD-10-CM

## 2022-04-22 DIAGNOSIS — R251 Tremor, unspecified: Secondary | ICD-10-CM

## 2022-04-22 DIAGNOSIS — R270 Ataxia, unspecified: Secondary | ICD-10-CM | POA: Diagnosis not present

## 2022-04-22 DIAGNOSIS — G250 Essential tremor: Secondary | ICD-10-CM

## 2022-04-22 DIAGNOSIS — D539 Nutritional anemia, unspecified: Secondary | ICD-10-CM | POA: Diagnosis not present

## 2022-04-22 DIAGNOSIS — R569 Unspecified convulsions: Secondary | ICD-10-CM | POA: Diagnosis not present

## 2022-04-22 DIAGNOSIS — Z20822 Contact with and (suspected) exposure to covid-19: Secondary | ICD-10-CM | POA: Insufficient documentation

## 2022-04-22 DIAGNOSIS — F319 Bipolar disorder, unspecified: Secondary | ICD-10-CM

## 2022-04-22 DIAGNOSIS — F17200 Nicotine dependence, unspecified, uncomplicated: Secondary | ICD-10-CM | POA: Diagnosis not present

## 2022-04-22 DIAGNOSIS — I10 Essential (primary) hypertension: Secondary | ICD-10-CM | POA: Insufficient documentation

## 2022-04-22 DIAGNOSIS — E871 Hypo-osmolality and hyponatremia: Secondary | ICD-10-CM | POA: Diagnosis not present

## 2022-04-22 DIAGNOSIS — Z79899 Other long term (current) drug therapy: Secondary | ICD-10-CM | POA: Insufficient documentation

## 2022-04-22 HISTORY — DX: Bipolar disorder, unspecified: F31.9

## 2022-04-22 HISTORY — DX: Gastro-esophageal reflux disease without esophagitis: K21.9

## 2022-04-22 LAB — RESP PANEL BY RT-PCR (FLU A&B, COVID) ARPGX2
Influenza A by PCR: NEGATIVE
Influenza B by PCR: NEGATIVE
SARS Coronavirus 2 by RT PCR: NEGATIVE

## 2022-04-22 LAB — CBC WITH DIFFERENTIAL/PLATELET
Abs Immature Granulocytes: 0.06 10*3/uL (ref 0.00–0.07)
Basophils Absolute: 0.1 10*3/uL (ref 0.0–0.1)
Basophils Relative: 1 %
Eosinophils Absolute: 0.3 10*3/uL (ref 0.0–0.5)
Eosinophils Relative: 3 %
HCT: 33.8 % — ABNORMAL LOW (ref 39.0–52.0)
Hemoglobin: 11 g/dL — ABNORMAL LOW (ref 13.0–17.0)
Immature Granulocytes: 1 %
Lymphocytes Relative: 17 %
Lymphs Abs: 1.8 10*3/uL (ref 0.7–4.0)
MCH: 34 pg (ref 26.0–34.0)
MCHC: 32.5 g/dL (ref 30.0–36.0)
MCV: 104.3 fL — ABNORMAL HIGH (ref 80.0–100.0)
Monocytes Absolute: 0.9 10*3/uL (ref 0.1–1.0)
Monocytes Relative: 9 %
Neutro Abs: 7.7 10*3/uL (ref 1.7–7.7)
Neutrophils Relative %: 69 %
Platelets: 176 10*3/uL (ref 150–400)
RBC: 3.24 MIL/uL — ABNORMAL LOW (ref 4.22–5.81)
RDW: 12.5 % (ref 11.5–15.5)
WBC: 10.9 10*3/uL — ABNORMAL HIGH (ref 4.0–10.5)
nRBC: 0 % (ref 0.0–0.2)

## 2022-04-22 LAB — URINALYSIS, ROUTINE W REFLEX MICROSCOPIC
Bilirubin Urine: NEGATIVE
Glucose, UA: NEGATIVE mg/dL
Hgb urine dipstick: NEGATIVE
Ketones, ur: NEGATIVE mg/dL
Leukocytes,Ua: NEGATIVE
Nitrite: NEGATIVE
Protein, ur: NEGATIVE mg/dL
Specific Gravity, Urine: 1.012 (ref 1.005–1.030)
pH: 6 (ref 5.0–8.0)

## 2022-04-22 LAB — COMPREHENSIVE METABOLIC PANEL
ALT: 17 U/L (ref 0–44)
AST: 23 U/L (ref 15–41)
Albumin: 3.7 g/dL (ref 3.5–5.0)
Alkaline Phosphatase: 73 U/L (ref 38–126)
Anion gap: 6 (ref 5–15)
BUN: 29 mg/dL — ABNORMAL HIGH (ref 8–23)
CO2: 22 mmol/L (ref 22–32)
Calcium: 8.3 mg/dL — ABNORMAL LOW (ref 8.9–10.3)
Chloride: 106 mmol/L (ref 98–111)
Creatinine, Ser: 1.19 mg/dL (ref 0.61–1.24)
GFR, Estimated: >60 mL/min (ref >60)
Glucose, Bld: 104 mg/dL — ABNORMAL HIGH (ref 70–99)
Potassium: 4.9 mmol/L (ref 3.5–5.1)
Sodium: 134 mmol/L — ABNORMAL LOW (ref 135–145)
Total Bilirubin: 0.5 mg/dL (ref 0.3–1.2)
Total Protein: 7.1 g/dL (ref 6.5–8.1)

## 2022-04-22 MED ORDER — PRIMIDONE 50 MG PO TABS
50.0000 mg | ORAL_TABLET | Freq: Every day | ORAL | Status: DC
  Administered 2022-04-23: 50 mg via ORAL
  Filled 2022-04-22: qty 1

## 2022-04-22 MED ORDER — NICOTINE 21 MG/24HR TD PT24
21.0000 mg | MEDICATED_PATCH | Freq: Every day | TRANSDERMAL | Status: DC
Start: 2022-04-23 — End: 2022-04-23
  Filled 2022-04-22: qty 1

## 2022-04-22 MED ORDER — GABAPENTIN 400 MG PO CAPS
800.0000 mg | ORAL_CAPSULE | Freq: Every day | ORAL | Status: DC
  Administered 2022-04-23: 800 mg via ORAL
  Filled 2022-04-22 (×2): qty 2

## 2022-04-22 MED ORDER — ACETAMINOPHEN 160 MG/5ML PO SOLN: 650.0000 mg | ORAL | Status: DC | PRN

## 2022-04-22 MED ORDER — STROKE: EARLY STAGES OF RECOVERY BOOK
Freq: Once | Status: DC
  Filled 2022-04-22: qty 1

## 2022-04-22 MED ORDER — PANTOPRAZOLE SODIUM 40 MG PO TBEC
40.0000 mg | DELAYED_RELEASE_TABLET | Freq: Every day | ORAL | Status: DC
Start: 2022-04-23 — End: 2022-04-23
  Filled 2022-04-22: qty 1

## 2022-04-22 MED ORDER — SODIUM CHLORIDE 0.9 % IV SOLN: INTRAVENOUS | Status: DC

## 2022-04-22 MED ORDER — QUETIAPINE FUMARATE 25 MG PO TABS
200.0000 mg | ORAL_TABLET | Freq: Every day | ORAL | Status: DC
  Administered 2022-04-23: 200 mg via ORAL
  Filled 2022-04-22: qty 2

## 2022-04-22 MED ORDER — ACETAMINOPHEN 325 MG PO TABS
650.0000 mg | ORAL_TABLET | ORAL | Status: DC | PRN
  Administered 2022-04-22: 650 mg via ORAL
  Filled 2022-04-22: qty 2

## 2022-04-22 MED ORDER — LISINOPRIL 10 MG PO TABS: 10.0000 mg | ORAL_TABLET | Freq: Every day | ORAL | Status: DC

## 2022-04-22 MED ORDER — BUPRENORPHINE HCL-NALOXONE HCL 8-2 MG SL SUBL
1.0000 | SUBLINGUAL_TABLET | Freq: Once | SUBLINGUAL | Status: AC
  Administered 2022-04-22: 1 via SUBLINGUAL
  Filled 2022-04-22: qty 1

## 2022-04-22 MED ORDER — ACETAMINOPHEN 650 MG RE SUPP: 650.0000 mg | RECTAL | Status: DC | PRN

## 2022-04-22 NOTE — ED Notes (Signed)
Pt given graham crackers and ginger ale ?

## 2022-04-22 NOTE — ED Notes (Signed)
Pt is agitated and frustrated. Raising his voice to this RN regarding wanting his medication. Pt refuses care until he receives his medication seroquel and gabapentin. EDP made aware. Will continue monitor ? ?

## 2022-04-22 NOTE — ED Triage Notes (Signed)
Reports fever x 3 days with body aches and dizziness.  Pt is alert and ambulatory.  Resp even and unlabored.  Skin warm and dry.  Reports weakness.  Reports he feels"like a drunk sailor." ?

## 2022-04-22 NOTE — ED Notes (Signed)
Provider at bedside

## 2022-04-22 NOTE — H&P (Addendum)
History and Physical    Patient: Gabriel FordDonald E Walkowski UVO:[id number] DOB: Sep 12, 1946 DOA: 04/22/2022 DOS: the patient was seen and examined on 04/23/2022 PCP: Pcp, No  Patient coming from: Home  Chief Complaint:  Chief Complaint  Patient presents with   Fever   HPI: Gabriel FordDonald E Henderson is a 76 y.o. male with medical history significant of hypertension, seizures, essential tremors, and bipolar disorder who presents to the emergency department due to 5-day onset of sudden change in ambulation, patient states that he was walking as if he was drunk despite taking no alcohol, he states that he has been unsteady on his feet and complained of about 3 days of having fever (temperature at home was 101F).  He denies chest pain, cough, ringing in the ears, headache, shortness of breath, nausea, vomiting or abdominal pain   ED Course:  In the emergency department, he was bradycardic, BP was 137/71 and other vital signs were within normal range.  Work-up in the ED showed leukocytosis, macrocytic anemia, hyponatremia, BUN 29, creatinine 1.19.  Influenza A, B, SARS coronavirus 2 was negative. CT head without contrast showed no acute or acute abnormality Chest x-ray showed mild increased interstitial changes of uncertain chronicity. No infiltrate is seen. Patient was treated with Suboxone.  Hospitalist was asked to admit patient for further evaluation and management.   Review of Systems: Review of systems as noted in the HPI. All other systems reviewed and are negative.   Past Medical History:  Diagnosis Date   Bipolar 1 disorder (HCC)    GERD (gastroesophageal reflux disease)    Hypertension    History reviewed. No pertinent surgical history.  Social History:  reports that he has been smoking. He does not have any smokeless tobacco history on file. He reports that he does not drink alcohol and does not use drugs.   No Known Allergies  History reviewed. No pertinent family history.    Prior to  Admission medications   Medication Sig Start Date End Date Taking? Authorizing Provider  amLODipine (NORVASC) 10 MG tablet Take 10 mg by mouth daily.   Yes [provider]  chlorhexidine (PERIDEX) 0.12 % solution SMARTSIG:15 By Mouth Twice Daily 02/25/22  Yes [provider]  gabapentin (NEURONTIN) 400 MG capsule Take 800 mg by mouth daily.   Yes [provider]  lisinopril (PRINIVIL,ZESTRIL) 20 MG tablet Take 10 mg by mouth daily.   Yes [provider]  nicotine (NICODERM CQ - DOSED IN MG/24 HOURS) 21 mg/24hr patch 21 mg daily. 04/14/22  Yes [provider]  omeprazole (PRILOSEC) 20 MG capsule Take 40 mg by mouth 2 (two) times daily before a meal.   Yes [provider]  primidone (MYSOLINE) 50 MG tablet    Yes [provider]  QUEtiapine (SEROQUEL) 200 MG tablet Take 200 mg by mouth at bedtime.   Yes [provider]  SUBOXONE 8-2 MG FILM Place under the tongue daily. 04/14/22  Yes [provider]  sulfaSALAzine (AZULFIDINE) 500 MG EC tablet Take 500 mg by mouth 2 (two) times daily.    [provider]    Physical Exam: BP 106/64   Pulse (!) 54   Temp 100 F (37.8 C) (Oral)   Resp 15   Ht 6\' 1"  (1.854 m)   Wt 71.7 kg   SpO2 98%   BMI 20.85 kg/m   General: 76 y.o. year-old male well developed well nourished in no acute distress.  Alert and oriented x3. HEENT: NCAT, EOMI  Neck: Supple, trachea medial Cardiovascular: Regular rate and rhythm with no rubs or gallops.  No thyromegaly or JVD noted.  No lower extremity edema. 2/4 pulses in all 4 extremities. Respiratory: Clear to auscultation with no wheezes or rales. Good inspiratory effort. Abdomen: Soft, nontender nondistended with normal bowel sounds x4 quadrants. Muskuloskeletal:No cyanosis, clubbing or edema noted bilaterally Neuro: Normal gait on ambulation.  CN II-XII intact, strength 5/5 x 4, sensation, reflexes intact Skin: No ulcerative lesions  noted or rashes Psychiatry: Judgement and insight appear normal. Mood is appropriate for condition and setting          Labs on Admission:  Basic Metabolic Panel: Recent Labs  Lab 04/22/22 1949  NA 134*  K 4.9  CL 106  CO2 22  GLUCOSE 104*  BUN 29*  CREATININE 1.19  CALCIUM 8.3*   Liver Function Tests: Recent Labs  Lab 04/22/22 1949  AST 23  ALT 17  ALKPHOS 73  BILITOT 0.5  PROT 7.1  ALBUMIN 3.7   No results for input(s): LIPASE, AMYLASE in the last 168 hours. No results for input(s): AMMONIA in the last 168 hours. CBC: Recent Labs  Lab 04/22/22 1949  WBC 10.9*  NEUTROABS 7.7  HGB 11.0*  HCT 33.8*  MCV 104.3*  PLT 176   Cardiac Enzymes: No results for input(s): CKTOTAL, CKMB, CKMBINDEX, TROPONINI in the last 168 hours.  BNP (last 3 results) No results for input(s): BNP in the last 8760 hours.  ProBNP (last 3 results) No results for input(s): PROBNP in the last 8760 hours.  CBG: No results for input(s): GLUCAP in the last 168 hours.  Radiological Exams on Admission: CT Head Wo Contrast  Result Date: 04/22/2022 CLINICAL DATA:  Fever and stroke-like symptoms, initial encounter EXAM: CT HEAD WITHOUT CONTRAST TECHNIQUE: Contiguous axial images were obtained from the base of the skull through the vertex without intravenous contrast. RADIATION DOSE REDUCTION: This exam was performed according to the departmental dose-optimization program which includes automated exposure control, adjustment of the mA and/or kV according to patient size and/or use of iterative reconstruction technique. COMPARISON:  None Available. FINDINGS: Brain: No evidence of acute infarction, hemorrhage, hydrocephalus, extra-axial collection or mass lesion/mass effect. Vascular: No hyperdense vessel or unexpected calcification. Skull: Normal. Negative for fracture or focal lesion. Sinuses/Orbits: No acute finding. Other: None. IMPRESSION: No acute intracranial abnormality noted. Electronically  Signed   By: Alcide Clever M.D.   On: 04/22/2022 20:14   DG Chest Portable 1 View  Result Date: 04/22/2022 CLINICAL DATA:  Fevers for several days, initial encounter EXAM: PORTABLE CHEST 1 VIEW COMPARISON:  01/24/2017 FINDINGS: Cardiac shadow is within normal limits. The lungs are well aerated bilaterally. Mild increased interstitial changes are noted without focal confluent infiltrate. No sizable effusion is noted. No bony abnormality is seen. IMPRESSION: Mild increased interstitial changes of uncertain chronicity. No infiltrate is seen. Electronically Signed   By: Alcide Clever M.D.   On: 04/22/2022 20:12    EKG: I independently viewed the EKG done and my findings are as followed: EKG was not done in the ED  Assessment/Plan Present on Admission: **None**  Principal Problem:   Ataxia Active Problems:   Essential hypertension   Hyponatremia   Macrocytic anemia   Elevated MCV   Seizures (HCC)   Bipolar disorder (HCC)   Essential tremor  Ataxia rule out acute ischemic stroke Ataxia has resolved with patient being observed with normal gait in the ED Of head without contrast showed no acute recommend finding  Patient will be admitted to telemetry unit  Bilateral carotid ultrasound in the morning Echocardiogram in the morning MRI of brain without contrast in the morning Continue aspirin 81 mg Consider starting patient on statin based on MRI findings and lipid panel Continue fall precautions and neuro checks Lipid panel and hemoglobin A1c will be checked Continue PT/SLP/OT eval and treat Bedside swallow eval by nursing prior to diet Neurology will be consulted and we shall await further recommendation.    Hyponatremia Na 134, continue IV hydration  Macrocytic Anemia Elevated MCV MCV 104.3, folic acid and vitamin B12 levels will be checked  Hypertension  Antihypertensives PRN if Blood pressure is greater than 220/120 or there is a concern for End organ damage/contraindications for  permissive HTN. If blood pressure is greater than 220/120 give labetalol PO or IV or Vasotec IV with a goal of 15% reduction in BP during the first 24 hours.  Seizures Continue gabapentin  Essential tremors Continue primidone  Bipolar disorder  Continue Seroquel  Prior narcotic abuse Continue Suboxone  DVT prophylaxis: SCDs  Code Status: Full code  Consults: Neurology  Family Communication: None at bedside  Severity of Illness: The appropriate patient status for this patient is OBSERVATION. Observation status is judged to be reasonable and necessary in order to provide the required intensity of service to ensure the patient's safety. The patient's presenting symptoms, physical exam findings, and initial radiographic and laboratory data in the context of their medical condition is felt to place them at decreased risk for further clinical deterioration. Furthermore, it is anticipated that the patient will be medically stable for discharge from the hospital within 2 midnights of admission.   Author: Frankey Shown, DO 04/23/2022 4:38 AM  For on call review [email or web address].

## 2022-04-22 NOTE — ED Notes (Signed)
Pt is agitated. Came out of room demanding he gets his medications and something for a headache. States he already informed EDP of what medications he takes at night. EDP made aware ?

## 2022-04-22 NOTE — ED Provider Notes (Signed)
?[facility]PART[facility] ?Provider Note ? ? ?CSN: [id number] ?Arrival date & time: 04/22/22  1800 ? ?  ? ?History ? ?Chief Complaint  ?Patient presents with  ? Fever  ? ? ?Gabriel Henderson is a 76 y.o. male. ? ? ?Fever ?Associated symptoms: no rash   ?Patient presents with fever for around 3 days.  Body aches and dizziness.  States he feels unsteady.  States he feels as if he is drunk.  Patient is not intoxicated.  States he is having difficulty walking because he is unsteady.  No headache.  No ringing in his ears.  No cough.  Does have some mild body aches.  States temperatures up to 101 at home.  No headache. ?  ?Past Medical History:  ?Diagnosis Date  ? Bipolar 1 disorder (HCC)   ? GERD (gastroesophageal reflux disease)   ? Hypertension   ? ? ?Home Medications ?Prior to Admission medications   ?Medication Sig Start Date End Date Taking? Authorizing Provider  ?amLODipine (NORVASC) 10 MG tablet Take 10 mg by mouth daily.    [provider]  ?gabapentin (NEURONTIN) 400 MG capsule Take 800 mg by mouth daily.    [provider]  ?lisinopril (PRINIVIL,ZESTRIL) 20 MG tablet Take 10 mg by mouth daily.    [provider]  ?omeprazole (PRILOSEC) 20 MG capsule Take 40 mg by mouth 2 (two) times daily before a meal.    [provider]  ?QUEtiapine (SEROQUEL) 200 MG tablet Take 200 mg by mouth at bedtime.    [provider]  ?sulfaSALAzine (AZULFIDINE) 500 MG EC tablet Take 500 mg by mouth 2 (two) times daily.    [provider]  ?   ? ?Allergies    ?Patient has no known allergies.   ? ?Review of Systems   ?Review of Systems  ?Constitutional:  Positive for fever.  ?Respiratory:  Negative for shortness of breath.   ?Gastrointestinal:  Negative for abdominal pain.  ?Musculoskeletal:  Negative for back pain.  ?Skin:  Negative for rash.  ?Neurological:  Positive for dizziness.  ? ?Physical Exam ?Updated Vital Signs ?BP 134/73   Pulse 68   Temp 100 ?F (37.8 ?C) (Oral)    Resp 18   Ht 6\' 1"  (1.854 m)   Wt 71.7 kg   SpO2 95%   BMI 20.85 kg/m?  ?Physical Exam ?Vitals and nursing note reviewed.  ?HENT:  ?   Head: Atraumatic.  ?   Right Ear: Tympanic membrane normal.  ?   Ears:  ?   Comments: Left TM inferiorly has potentially wax on it. ?Eyes:  ?   Extraocular Movements: Extraocular movements intact.  ?   Pupils: Pupils are equal, round, and reactive to light.  ?Cardiovascular:  ?   Rate and Rhythm: Normal rate.  ?Pulmonary:  ?   Breath sounds: No wheezing or rhonchi.  ?Abdominal:  ?   Tenderness: There is no abdominal tenderness.  ?Musculoskeletal:  ?   Cervical back: Neck supple.  ?Skin: ?   Capillary Refill: Capillary refill takes less than 2 seconds.  ?Neurological:  ?   Mental Status: He is alert and oriented to person, place, and time.  ?   Comments: Finger-nose intact on left.  On the right may be a little off.  With Romberg fell to the left.  Gait overall was reassuring.  ? ? ?ED Results / Procedures / Treatments   ?Labs ?(all labs ordered are listed, but only abnormal results are displayed) ?Labs  Reviewed  ?COMPREHENSIVE METABOLIC PANEL - Abnormal; Notable for the following components:  ?    Result Value  ? Sodium 134 (*)   ? Glucose, Bld 104 (*)   ? BUN 29 (*)   ? Calcium 8.3 (*)   ? All other components within normal limits  ?CBC WITH DIFFERENTIAL/PLATELET - Abnormal; Notable for the following components:  ? WBC 10.9 (*)   ? RBC 3.24 (*)   ? Hemoglobin 11.0 (*)   ? HCT 33.8 (*)   ? MCV 104.3 (*)   ? All other components within normal limits  ?RESP PANEL BY RT-PCR (FLU A&B, COVID) [id number]  ?URINALYSIS, ROUTINE W REFLEX MICROSCOPIC  ? ? ?EKG ?None ? ?Radiology ?CT Head Wo Contrast ? ?Result Date: 04/22/2022 ?CLINICAL DATA:  Fever and stroke-like symptoms, initial encounter EXAM: CT HEAD WITHOUT CONTRAST TECHNIQUE: Contiguous axial images were obtained from the base of the skull through the vertex without intravenous contrast. RADIATION DOSE REDUCTION: This exam was performed  according to the departmental dose-optimization program which includes automated exposure control, adjustment of the mA and/or kV according to patient size and/or use of iterative reconstruction technique. COMPARISON:  None Available. FINDINGS: Brain: No evidence of acute infarction, hemorrhage, hydrocephalus, extra-axial collection or mass lesion/mass effect. Vascular: No hyperdense vessel or unexpected calcification. Skull: Normal. Negative for fracture or focal lesion. Sinuses/Orbits: No acute finding. Other: None. IMPRESSION: No acute intracranial abnormality noted. Electronically Signed   By: Alcide CleverMark  Lukens M.D.   On: 04/22/2022 20:14  ? ?DG Chest Portable 1 View ? ?Result Date: 04/22/2022 ?CLINICAL DATA:  Fevers for several days, initial encounter EXAM: PORTABLE CHEST 1 VIEW COMPARISON:  01/24/2017 FINDINGS: Cardiac shadow is within normal limits. The lungs are well aerated bilaterally. Mild increased interstitial changes are noted without focal confluent infiltrate. No sizable effusion is noted. No bony abnormality is seen. IMPRESSION: Mild increased interstitial changes of uncertain chronicity. No infiltrate is seen. Electronically Signed   By: Alcide CleverMark  Lukens M.D.   On: 04/22/2022 20:12   ? ?Procedures ?Procedures  ? ? ?Medications Ordered in ED ?Medications  ?buprenorphine-naloxone (SUBOXONE) 8-2 mg per SL tablet 1 tablet (1 tablet Sublingual Given 04/22/22 2119)  ? ? ?ED Course/ Medical Decision Making/ A&P ?  ?                        ?Medical Decision Making ?Amount and/or Complexity of Data Reviewed ?Labs: ordered. ?Radiology: ordered. ? ?Risk ?Prescription drug management. ? ? ?Patient presents with reported fever.  Has had for the last couple days.  However is also been feeling unsteady.  No cough.  No sore throat.  No headaches.  No abdominal pain.  No dysuria.  States that he has been having difficulty walking.  States that he feels as if he is falling when he is trying to walk.  Finger-nose may be slightly  off on the right.  With Romberg fell to the left.  Head CT independently interpreted and reassuring.  No stroke seen.  However I think he is high risk for central cause of the unsteadiness.  Has had for the last few days so not a tPA candidate.  Will admit to hospital to get MRI for further evaluation.  Patient is also on chronic pain medicines.  Reviewed drug database and is on Suboxone 8/2 three times a day. ? ?Initial differential diagnosis for fever is long and does include several different times of infection but also central cause of fever  such as intracranial hemorrhage. ?Dizziness/unsteadiness have a different causes also which could come from stroke, intracranial hemorrhage or a peripheral cause. ? ? ? ? ? ? ? ?Final Clinical Impression(s) / ED Diagnoses ?Final diagnoses:  ?Ataxia  ? ? ?Rx / DC Orders ?ED Discharge Orders   ? ? None  ? ?  ? ? ?  ?Benjiman Core, MD ?04/22/22 2145 ? ?

## 2022-04-22 NOTE — ED Notes (Signed)
Patient transported to CT 

## 2022-04-23 ENCOUNTER — Other Ambulatory Visit (HOSPITAL_COMMUNITY): Payer: Medicare Other

## 2022-04-23 ENCOUNTER — Ambulatory Visit (HOSPITAL_COMMUNITY): Admission: RE | Admit: 2022-04-23 | Payer: Medicare Other | Source: Ambulatory Visit

## 2022-04-23 ENCOUNTER — Observation Stay (HOSPITAL_COMMUNITY): Payer: Medicare Other

## 2022-04-23 DIAGNOSIS — E871 Hypo-osmolality and hyponatremia: Secondary | ICD-10-CM

## 2022-04-23 DIAGNOSIS — R569 Unspecified convulsions: Secondary | ICD-10-CM

## 2022-04-23 DIAGNOSIS — F319 Bipolar disorder, unspecified: Secondary | ICD-10-CM

## 2022-04-23 DIAGNOSIS — R270 Ataxia, unspecified: Secondary | ICD-10-CM | POA: Diagnosis not present

## 2022-04-23 DIAGNOSIS — D539 Nutritional anemia, unspecified: Secondary | ICD-10-CM

## 2022-04-23 DIAGNOSIS — R718 Other abnormality of red blood cells: Secondary | ICD-10-CM

## 2022-04-23 DIAGNOSIS — G250 Essential tremor: Secondary | ICD-10-CM

## 2022-04-23 LAB — CBC
HCT: 30.1 % — ABNORMAL LOW (ref 39.0–52.0)
Hemoglobin: 9.9 g/dL — ABNORMAL LOW (ref 13.0–17.0)
MCH: 34 pg (ref 26.0–34.0)
MCHC: 32.9 g/dL (ref 30.0–36.0)
MCV: 103.4 fL — ABNORMAL HIGH (ref 80.0–100.0)
Platelets: 144 10*3/uL — ABNORMAL LOW (ref 150–400)
RBC: 2.91 MIL/uL — ABNORMAL LOW (ref 4.22–5.81)
RDW: 12.5 % (ref 11.5–15.5)
WBC: 7.7 10*3/uL (ref 4.0–10.5)
nRBC: 0 % (ref 0.0–0.2)

## 2022-04-23 LAB — HEMOGLOBIN A1C
Hgb A1c MFr Bld: 5.3 % (ref 4.8–5.6)
Mean Plasma Glucose: 105.41 mg/dL

## 2022-04-23 LAB — VITAMIN B12: Vitamin B-12: 150 pg/mL — ABNORMAL LOW (ref 180–914)

## 2022-04-23 LAB — COMPREHENSIVE METABOLIC PANEL
ALT: 13 U/L (ref 0–44)
AST: 17 U/L (ref 15–41)
Albumin: 2.9 g/dL — ABNORMAL LOW (ref 3.5–5.0)
Alkaline Phosphatase: 63 U/L (ref 38–126)
Anion gap: 6 (ref 5–15)
BUN: 28 mg/dL — ABNORMAL HIGH (ref 8–23)
CO2: 22 mmol/L (ref 22–32)
Calcium: 8 mg/dL — ABNORMAL LOW (ref 8.9–10.3)
Chloride: 109 mmol/L (ref 98–111)
Creatinine, Ser: 1.18 mg/dL (ref 0.61–1.24)
GFR, Estimated: >60 mL/min (ref >60)
Glucose, Bld: 96 mg/dL (ref 70–99)
Potassium: 4.1 mmol/L (ref 3.5–5.1)
Sodium: 137 mmol/L (ref 135–145)
Total Bilirubin: 0.5 mg/dL (ref 0.3–1.2)
Total Protein: 6.1 g/dL — ABNORMAL LOW (ref 6.5–8.1)

## 2022-04-23 LAB — LIPID PANEL
Cholesterol: 117 mg/dL (ref 0–200)
HDL: 44 mg/dL (ref >40)
LDL Cholesterol: 63 mg/dL (ref 0–99)
Total CHOL/HDL Ratio: 2.7 RATIO
Triglycerides: 52 mg/dL (ref <150)
VLDL: 10 mg/dL (ref 0–40)

## 2022-04-23 LAB — MAGNESIUM: Magnesium: 2 mg/dL (ref 1.7–2.4)

## 2022-04-23 LAB — PHOSPHORUS: Phosphorus: 3.9 mg/dL (ref 2.5–4.6)

## 2022-04-23 LAB — FOLATE: Folate: 25.9 ng/mL (ref >5.9)

## 2022-04-23 MED ORDER — ASPIRIN EC 81 MG PO TBEC
81.0000 mg | DELAYED_RELEASE_TABLET | Freq: Every day | ORAL | Status: DC
  Filled 2022-04-23: qty 1

## 2022-04-23 MED ORDER — BUPRENORPHINE HCL-NALOXONE HCL 8-2 MG SL SUBL
1.0000 | SUBLINGUAL_TABLET | Freq: Every day | SUBLINGUAL | Status: DC
Start: 2022-04-23 — End: 2022-04-23
  Filled 2022-04-23: qty 1

## 2022-04-23 NOTE — Progress Notes (Addendum)
Walked into patient's room to give him his morning meds and he was not in his room. IV pulled out running on the floor. AMA paperwork was in the room but he did not sign it. Notified Dr. Mariea Clonts. ?

## 2022-04-23 NOTE — ED Notes (Signed)
Pt placed on 3L [place]. Pt desat while asleep to 86%. O2 now 93% ?

## 2022-04-23 NOTE — TOC Progression Note (Signed)
?  Transition of Care (TOC) Screening Note ? ? ?Patient Details  ?Name: NISSAN SOLIN ?Date of Birth: 02-28-1946 ? ? ?Transition of Care (TOC) CM/SW Contact:    ?Shade Flood, LCSW ?Phone Number: ?04/23/2022, 8:00 AM ? ? ? ?Transition of Care Department [facility]) has reviewed patient and no TOC needs have been identified at this time. We will continue to monitor patient advancement through interdisciplinary progression rounds. If new patient transition needs arise, please place a TOC consult. ? ? ?

## 2022-04-23 NOTE — Discharge Summary (Signed)
?                                                                                ? ? ?Gabriel Henderson, is a 76 y.o. male  DOB [birth year]  MRN [id number]. ? ?Admission date:  04/22/2022  Admitting Physician  Frankey Shown, DO ? ?Discharge Date:  04/23/2022  ? ?Primary MD  Pcp, No ? ?Recommendations for primary care physician for things to follow:  ? ?-Patient left prior to being seen by neurologist ?-He eloped ?-Please see nursing documentation from Karolee Ohs, RN ? ?Admission Diagnosis  Ataxia [R27.0] ? ? ?Discharge Diagnosis  Ataxia [R27.0]   ? ?Principal Problem: ?  Ataxia ?Active Problems: ?  Essential hypertension ?  Hyponatremia ?  Macrocytic anemia ?  Elevated MCV ?  Seizures (HCC) ?  Bipolar disorder (HCC) ?  Essential tremor ?    ? ?Past Medical History:  ?Diagnosis Date  ? Bipolar 1 disorder (HCC)   ? GERD (gastroesophageal reflux disease)   ? Hypertension   ? ? ?History reviewed. No pertinent surgical history. ? ? ? HPI  from the history and physical done on the day of admission:  ? ?HPI: Gabriel Henderson is a 76 y.o. male with medical history significant of hypertension, seizures, essential tremors, and bipolar disorder who presents to the emergency department due to 5-day onset of sudden change in ambulation, patient states that he was walking as if he was drunk despite taking no alcohol, he states that he has been unsteady on his feet and complained of about 3 days of having fever (temperature at home was 101F).  He denies chest pain, cough, ringing in the ears, headache, shortness of breath, nausea, vomiting or abdominal pain ? ?  ?ED Course:  ?In the emergency department, he was bradycardic, BP was 137/71 and other vital signs were within normal range.  Work-up in the ED showed leukocytosis, macrocytic anemia, hyponatremia, BUN 29, creatinine 1.19.  Influenza A, B, SARS coronavirus 2 was negative. ?CT head without contrast showed no acute or acute abnormality ?Chest x-ray showed mild increased  interstitial changes of uncertain chronicity. No infiltrate is seen. ?Patient was treated with Suboxone.  Hospitalist was asked to admit patient for further evaluation and management.  ?  ?Review of Systems: ?Review of systems as noted in the HPI. All other systems reviewed and are negative. ? ? ? ? Hospital Course:  ? ? ?Assessment and Plan: ? ?1) ataxia with concern for possible acute stroke--- MRI brain was recommended to rule out possible posterior circulation involvement given ataxia ?-Patient declined MRI brain, ?-Patient left prior to being seen by neurologist ?-He eloped ?-Please see nursing documentation from Karolee Ohs, RN ? ?2) history of bipolar disorder patient is on Seroquel ? ?3) chronic opiate/narcotic use/dependence--- PTA patient was on Suboxone therapy ? ?4) anemia--- hopefully patient will follow-up with PCP for further work-up ? ?-Please see full H&P dictated by Dr. Thomes Dinning within 24 hours of patient leaving ? ?Discharge Condition: -- -Patient left prior to being seen by neurologist, -He eloped ?-Please see nursing documentation from Karolee Ohs, RN ? ?Diet and Activity recommendation:  As advised ? ?Discharge Instructions   ? ?-  Patient left prior to being seen by neurologist ?-He eloped ?-Please see nursing documentation from Karolee Ohs, RN ? ? ? ? Discharge Medications  ? ? ? ?Major procedures and Radiology Reports - PLEASE review detailed and final reports for all details, in brief -  ? ?CT Head Wo Contrast ? ?Result Date: 04/22/2022 ?CLINICAL DATA:  Fever and stroke-like symptoms, initial encounter EXAM: CT HEAD WITHOUT CONTRAST TECHNIQUE: Contiguous axial images were obtained from the base of the skull through the vertex without intravenous contrast. RADIATION DOSE REDUCTION: This exam was performed according to the departmental dose-optimization program which includes automated exposure control, adjustment of the mA and/or kV according to patient size and/or use of iterative  reconstruction technique. COMPARISON:  None Available. FINDINGS: Brain: No evidence of acute infarction, hemorrhage, hydrocephalus, extra-axial collection or mass lesion/mass effect. Vascular: No hyperdense vessel or unexpected calcification. Skull: Normal. Negative for fracture or focal lesion. Sinuses/Orbits: No acute finding. Other: None. IMPRESSION: No acute intracranial abnormality noted. Electronically Signed   By: Alcide Clever M.D.   On: 04/22/2022 20:14  ? ?US Carotid Bilateral ? ?Result Date: 04/23/2022 ?CLINICAL DATA:  Hypertension stroke symptoms EXAM: BILATERAL CAROTID DUPLEX ULTRASOUND TECHNIQUE: Wallace Cullens scale imaging, color Doppler and duplex ultrasound were performed of bilateral carotid and vertebral arteries in the neck. COMPARISON:  None Available. FINDINGS: Criteria: Quantification of carotid stenosis is based on velocity parameters that correlate the residual internal carotid diameter with NASCET-based stenosis levels, using the diameter of the distal internal carotid lumen as the denominator for stenosis measurement. The following velocity measurements were obtained: RIGHT ICA: 110/37 cm/sec CCA: 88/17 cm/sec SYSTOLIC ICA/CCA RATIO:  1.3 ECA: 62 cm/sec LEFT ICA: 113/39 cm/sec CCA: 95/22 cm/sec SYSTOLIC ICA/CCA RATIO:  1.2 ECA: 65 cm/sec RIGHT CAROTID ARTERY: Heterogeneous bifurcation atherosclerosis. Negative for stenosis, velocity elevation, or turbulent flow. No waveform abnormality. Degree of narrowing less than 50% by ultrasound criteria. RIGHT VERTEBRAL ARTERY:  Normal antegrade flow LEFT CAROTID ARTERY: Similar heterogeneous moderate bifurcation atherosclerosis. Negative for stenosis, velocity elevation, turbulent flow. Degree of narrowing also less than 50% by ultrasound criteria. LEFT VERTEBRAL ARTERY:  Normal antegrade flow IMPRESSION: Moderate bilateral carotid atherosclerosis. Negative for significant stenosis. Degree of narrowing less than 50% bilaterally by ultrasound criteria. Patent  antegrade vertebral flow bilaterally Electronically Signed   By: Judie Petit.  Shick M.D.   On: 04/23/2022 08:37  ? ?DG Chest Portable 1 View ? ?Result Date: 04/22/2022 ?CLINICAL DATA:  Fevers for several days, initial encounter EXAM: PORTABLE CHEST 1 VIEW COMPARISON:  01/24/2017 FINDINGS: Cardiac shadow is within normal limits. The lungs are well aerated bilaterally. Mild increased interstitial changes are noted without focal confluent infiltrate. No sizable effusion is noted. No bony abnormality is seen. IMPRESSION: Mild increased interstitial changes of uncertain chronicity. No infiltrate is seen. Electronically Signed   By: Alcide Clever M.D.   On: 04/22/2022 20:12   ? ?Micro Results  ? ?Recent Results (from the past 240 hour(s))  ?Resp Panel by RT-PCR (Flu A&B, Covid) Nasopharyngeal Swab     Status: None  ? Collection Time: 04/22/22  7:49 PM  ? Specimen: Nasopharyngeal Swab; Nasopharyngeal(NP) swabs in vial transport medium  ?Result Value Ref Range Status  ? SARS Coronavirus 2 by RT PCR NEGATIVE NEGATIVE Final  ?  Comment: (NOTE) ?SARS-CoV-2 target nucleic acids are NOT DETECTED. ? ?The SARS-CoV-2 RNA is generally detectable in upper respiratory ?specimens during the acute phase of infection. The lowest ?concentration of SARS-CoV-2 viral copies this assay can detect is ?138 copies/mL.  A negative result does not preclude SARS-Cov-2 ?infection and should not be used as the sole basis for treatment or ?other patient management decisions. A negative result may occur with  ?improper specimen collection/handling, submission of specimen other ?than nasopharyngeal swab, presence of viral mutation(s) within the ?areas targeted by this assay, and inadequate number of viral ?copies(<138 copies/mL). A negative result must be combined with ?clinical observations, patient history, and epidemiological ?information. The expected result is Negative. ? ?Fact Sheet for Patients:  ?BloggerCourse.comhttps://www.fda.gov/media/152166/download ? ?Fact Sheet for  Healthcare Providers:  ?SeriousBroker.ithttps://www.fda.gov/media/152162/download ? ?This test is no t yet approved or cleared by the [place] FDA and  ?has been authorized for detection and/or diagnosis of SARS-CoV-2 by ?FDA under

## 2022-04-23 NOTE — Progress Notes (Signed)
OT Cancellation Note ? ?Patient Details ?Name: Gabriel Henderson ?MRN: [id number] ?DOB: Aug 19, 1946 ? ? ?Cancelled Treatment:    Reason Eval/Treat Not Completed: OT screened, no needs identified, will sign off. Pt reported feeling back to baseline. Pt able to ambulate in room and demonstrated good B UE strength. Pt will be removed from OT list.  ? ?Danie Chandler OT, MOT ? ? ?Danie Chandler ?04/23/2022, 8:29 AM ?

## 2022-04-23 NOTE — ED Notes (Signed)
Switched pt's ED bed with a floor bed ?

## 2022-04-23 NOTE — Progress Notes (Signed)
SLP Cancellation Note ? ?Patient Details ?Name: Gabriel Henderson ?MRN: [id number] ?DOB: 1946-02-25 ? ? ?Cancelled treatment:       Reason Eval/Treat Not Completed: SLP screened, no needs identified, will sign off. Pt was very frustrated, hungry and thirsty. Pt passed the [place] last night but does not have a diet order at this time. RN notified MD. SLP provided snacks while Pending diet order being placed. Pt's language and cognition are functioning at baseline. ST will sign off, thank you. ? ?Chasitee Zenker H. Myer Haff MA, CCC-SLP ?Speech Language Pathologist ? ? ? ?Gabriel Henderson ?04/23/2022, 9:58 AM ?

## 2022-04-23 NOTE — Progress Notes (Signed)
Patient is threatening to leave AMA unless he gets his meds, including suboxone, immediately. I secure chatted Dr. Mariea Clonts to inform him of the situation. He came and talked with patient.  ?AMA paperwork in room. Patient is refusing everything else, like assessment and tele  ?

## 2022-04-25 ENCOUNTER — Emergency Department (HOSPITAL_COMMUNITY): Payer: Medicare Other

## 2022-04-25 ENCOUNTER — Other Ambulatory Visit: Payer: Self-pay

## 2022-04-25 ENCOUNTER — Encounter (HOSPITAL_COMMUNITY): Payer: Self-pay

## 2022-04-25 ENCOUNTER — Emergency Department (HOSPITAL_COMMUNITY)
Admission: EM | Admit: 2022-04-25 | Discharge: 2022-04-25 | Disposition: A | Discharge status: Home / Self Care | Payer: Medicare Other | Attending: Emergency Medicine | Admitting: Emergency Medicine

## 2022-04-25 DIAGNOSIS — R509 Fever, unspecified: Secondary | ICD-10-CM | POA: Diagnosis not present

## 2022-04-25 DIAGNOSIS — R42 Dizziness and giddiness: Secondary | ICD-10-CM | POA: Insufficient documentation

## 2022-04-25 LAB — URINALYSIS, ROUTINE W REFLEX MICROSCOPIC
Bilirubin Urine: NEGATIVE
Glucose, UA: NEGATIVE mg/dL
Hgb urine dipstick: NEGATIVE
Ketones, ur: NEGATIVE mg/dL
Leukocytes,Ua: NEGATIVE
Nitrite: NEGATIVE
Protein, ur: NEGATIVE mg/dL
Specific Gravity, Urine: 1.013 (ref 1.005–1.030)
pH: 5 (ref 5.0–8.0)

## 2022-04-25 LAB — CBC WITH DIFFERENTIAL/PLATELET
Abs Immature Granulocytes: 0.04 10*3/uL (ref 0.00–0.07)
Basophils Absolute: 0 10*3/uL (ref 0.0–0.1)
Basophils Relative: 0 %
Eosinophils Absolute: 0.3 10*3/uL (ref 0.0–0.5)
Eosinophils Relative: 3 %
HCT: 32.4 % — ABNORMAL LOW (ref 39.0–52.0)
Hemoglobin: 10.4 g/dL — ABNORMAL LOW (ref 13.0–17.0)
Immature Granulocytes: 1 %
Lymphocytes Relative: 18 %
Lymphs Abs: 1.4 10*3/uL (ref 0.7–4.0)
MCH: 33 pg (ref 26.0–34.0)
MCHC: 32.1 g/dL (ref 30.0–36.0)
MCV: 102.9 fL — ABNORMAL HIGH (ref 80.0–100.0)
Monocytes Absolute: 0.8 10*3/uL (ref 0.1–1.0)
Monocytes Relative: 10 %
Neutro Abs: 5.3 10*3/uL (ref 1.7–7.7)
Neutrophils Relative %: 68 %
Platelets: 209 10*3/uL (ref 150–400)
RBC: 3.15 MIL/uL — ABNORMAL LOW (ref 4.22–5.81)
RDW: 12.2 % (ref 11.5–15.5)
WBC: 7.8 10*3/uL (ref 4.0–10.5)
nRBC: 0 % (ref 0.0–0.2)

## 2022-04-25 LAB — COMPREHENSIVE METABOLIC PANEL
ALT: 19 U/L (ref 0–44)
AST: 23 U/L (ref 15–41)
Albumin: 3.2 g/dL — ABNORMAL LOW (ref 3.5–5.0)
Alkaline Phosphatase: 64 U/L (ref 38–126)
Anion gap: 7 (ref 5–15)
BUN: 24 mg/dL — ABNORMAL HIGH (ref 8–23)
CO2: 21 mmol/L — ABNORMAL LOW (ref 22–32)
Calcium: 8.6 mg/dL — ABNORMAL LOW (ref 8.9–10.3)
Chloride: 108 mmol/L (ref 98–111)
Creatinine, Ser: 1.18 mg/dL (ref 0.61–1.24)
GFR, Estimated: >60 mL/min (ref >60)
Glucose, Bld: 78 mg/dL (ref 70–99)
Potassium: 4.4 mmol/L (ref 3.5–5.1)
Sodium: 136 mmol/L (ref 135–145)
Total Bilirubin: 0.6 mg/dL (ref 0.3–1.2)
Total Protein: 6.7 g/dL (ref 6.5–8.1)

## 2022-04-25 NOTE — Discharge Instructions (Signed)
You have been evaluated for dizziness.  Your lab work and imaging including your MRI did not show anything acute.  Please follow-up with your doctor for further evaluation.  Thank you. ?

## 2022-04-25 NOTE — ED Provider Notes (Signed)
?[facility] EMERGENCY DEPARTMENT ?Provider Note ? ? ?CSN: [id number] ?Arrival date & time: 04/25/22  1011 ? ?  ? ?History ? ?No chief complaint on file. ? ? ?Gabriel Henderson is a 76 y.o. male. ? ?Pt complains of running a fever yesterday and the day before.  Patient reports he became very cold and had to sit by an electric heater then he became very hot he checked his temperature and his temperature was 102.  Patient reports he has been feeling dizzy for the past 2 weeks he was seen in any pain in the emergency department on 5-4 and had a CT scan of his head for evaluation.  Patient was post to have an MRI but he decided to leave AGAINST MEDICAL ADVICE before MRI was done. ? ?The history is provided by the patient. No language interpreter was used.  ? ?  ? ?Home Medications ?Prior to Admission medications   ?Medication Sig Start Date End Date Taking? Authorizing Provider  ?amLODipine (NORVASC) 10 MG tablet Take 10 mg by mouth daily.    [provider]  ?chlorhexidine (PERIDEX) 0.12 % solution Use as directed 15 mLs in the mouth or throat 2 (two) times daily. 02/25/22   [provider]  ?gabapentin (NEURONTIN) 400 MG capsule Take 800 mg by mouth daily.    [provider]  ?lisinopril (PRINIVIL,ZESTRIL) 20 MG tablet Take 10 mg by mouth daily.    [provider]  ?nicotine (NICODERM CQ - DOSED IN MG/24 HOURS) 21 mg/24hr patch 21 mg daily. 04/14/22   [provider]  ?omeprazole (PRILOSEC) 20 MG capsule Take 40 mg by mouth 2 (two) times daily before a meal.    [provider]  ?primidone (MYSOLINE) 50 MG tablet Take 50 mg by mouth daily.    [provider]  ?QUEtiapine (SEROQUEL) 200 MG tablet Take 200 mg by mouth at bedtime.    [provider]  ?SUBOXONE 8-2 MG FILM Place 3 Film under the tongue daily. 04/14/22   [provider]  ?sulfaSALAzine (AZULFIDINE) 500 MG EC tablet Take 500 mg by mouth 2 (two) times daily.    [provider]  ?   ? ?Allergies    ?Patient has no known allergies.   ? ?Review of Systems   ?Review of Systems  ?Constitutional:  Positive for fever.  ?Respiratory:  Negative for cough.   ?Neurological:  Positive for dizziness, syncope and weakness. Negative for seizures and numbness.  ?All other systems reviewed and are negative. ? ?Physical Exam ?Updated Vital Signs ?There were no vitals taken for this visit. ?Physical Exam ?Vitals and nursing note reviewed.  ?Constitutional:   ?   General: He is not in acute distress. ?   Appearance: He is well-developed.  ?HENT:  ?   Head: Normocephalic and atraumatic.  ?   Nose: Nose normal.  ?   Mouth/Throat:  ?   Mouth: Mucous membranes are moist.  ?Eyes:  ?   Conjunctiva/sclera: Conjunctivae normal.  ?Cardiovascular:  ?   Rate and Rhythm: Normal rate and regular rhythm.  ?   Heart sounds: No murmur heard. ?Pulmonary:  ?   Effort: Pulmonary effort is normal. No respiratory distress.  ?   Breath sounds: Normal breath sounds.  ?Abdominal:  ?   Palpations: Abdomen is soft.  ?   Tenderness: There is no abdominal tenderness.  ?Musculoskeletal:     ?   General: No swelling.  ?   Cervical back: Neck  supple.  ?Skin: ?   General: Skin is warm and dry.  ?   Capillary Refill: Capillary refill takes less than 2 seconds.  ?Neurological:  ?   Mental Status: He is alert.  ?Psychiatric:     ?   Mood and Affect: Mood normal.  ? ? ?ED Results / Procedures / Treatments   ?Labs ?(all labs ordered are listed, but only abnormal results are displayed) ?Labs Reviewed - No data to display ? ?EKG ?None ? ?Radiology ?No results found. ? ?Procedures ?Procedures  ? ? ?Medications Ordered in ED ?Medications - No data to display ? ?ED Course/ Medical Decision Making/ A&P ?  ?                        ?Medical Decision Making ?Amount and/or Complexity of Data Reviewed ?Independent Historian: spouse ?   Details: Family here with pt,  Pt having dizziness for 2 weeks ?External Data Reviewed: labs and notes. ?    Details: Hospitalist notes reviewed.  Pt left ama before MRI ?Labs: ordered. Decision-making details documented in ED Course. ?Radiology: ordered and independent interpretation performed. Decision-making details documented in ED Course. ?   Details: chest xray normal  no pneumonia ?ECG/medicine tests: ordered and independent interpretation performed. Decision-making details documented in ED Course. ? ? ?MDM:  Dr. Jacqulyn Bath in to see and examine.  MRi ordered   ? ? ? ? ? ? ? ?Final Clinical Impression(s) / ED Diagnoses ?Final diagnoses:  ?Dizziness  ?Fever, unspecified fever cause  ? ? ?Rx / DC Orders ?ED Discharge Orders   ? ? None  ? ?  ? ? ?  ?Elson Areas, [place] ?04/25/22 1427 ? ?  ?Maia Plan, MD ?04/27/22 1009 ? ?

## 2022-04-25 NOTE — ED Triage Notes (Signed)
Patient states he has been feeling dizzy for a few days. Recently discharged from [facility] for same. States he thinks he has been running fevers as well. Denies N/V/D ?

## 2022-04-25 NOTE — ED Provider Notes (Signed)
Transfer of Care Note ?I assumed care of Gabriel Henderson on 04/25/2022 at @NOWNR @. ? ?Briefly, Gabriel Henderson is a 76 y.o. male who: ?- On Suboxon for chronic pain, lives in [place]  ?- Dizziness for 2 weeks, was seen in [place] for similar sx left AMA after CT Head, reports fever 102 ?- MRI to r/o CVA  ? ?  ?DG Chest [facility] 1 View  ?Final Result  ?  ?MR BRAIN WO CONTRAST    (Results Pending)  ? ?The plan includes: ?- F/u with MRI  ? ? ?Please refer to the original provider?s note for additional information regarding the care of Gabriel Henderson. ? ?### ?Reassessment: ?I personally reassessed the patient: ?-On my assessment, patient stated that he feels much better.  He denies any dizziness symptoms at this point.  He was moving all his extremities without any problem.  No focal deficit on my exam.  His vital signs are also within the reference range. ?Vitals:  ? 04/25/22 1600 04/25/22 1635  ?BP: 113/63 108/65  ?Pulse: 65 69  ?Resp: 17 (!) 23  ?Temp:    ?SpO2: 94% 95%  ? ? ?Additional MDM: ?-Patient MRI did not reveal any acute etiology.  Less concern for acute CVA at this time.  I have updated patient about his MRI result.  Patient is happy and is ready to go home.  Strict return precaution has been discussed.  I have recommended follow-up with his primary care provider.  Patient understand and agrees with plan. ?  ?06/25/22, MD ?04/25/22 1819 ? ?  ?06/25/22, MD ?04/26/22 1515 ? ?

## 2022-04-25 NOTE — ED Notes (Signed)
Patient taken to MRI at this time 

## 2022-04-28 ENCOUNTER — Telehealth (HOSPITAL_COMMUNITY): Payer: Self-pay

## 2022-04-30 LAB — CULTURE, BLOOD (ROUTINE X 2)
Culture: NO GROWTH
Culture: NO GROWTH
Special Requests: ADEQUATE

## 2023-08-05 IMAGING — MR MR HEAD W/O CM
12 of 13 series · 44 of 48 positions shown · non-contrast
Comparison: Head CT 04/22/2022

CLINICAL DATA: Neuro deficit, acute, stroke suspected.

EXAM:
MRI HEAD WITHOUT CONTRAST
TECHNIQUE: Multiplanar, multiecho pulse sequences of the brain and surrounding
structures were obtained without intravenous contrast.

[Series 5: DWI · axial · 3.0mm · 0.88mm/px · z∈[-73,+85]mm · 7 of 108 slices shown (1 of 4)]
[im 1/108]
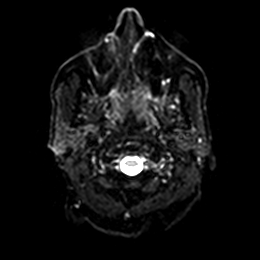
[im 18/108]
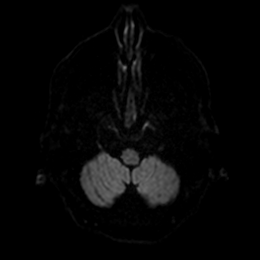
[im 36/108]
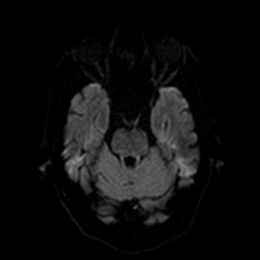
[im 54/108]
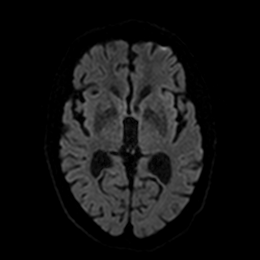
[im 72/108]
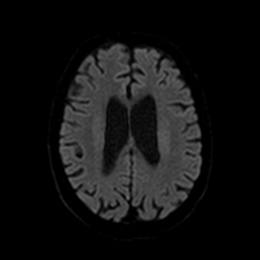
[im 90/108]
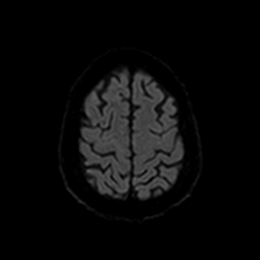
[im 108/108]
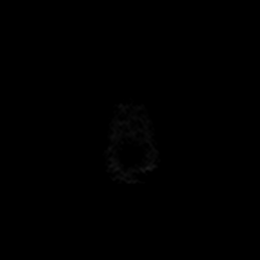

[Series 6: DWI · axial · 3.0mm · 0.88mm/px · z∈[-73,+85]mm · 4 of 54 slices shown (2 of 4)]
[im 1/54]
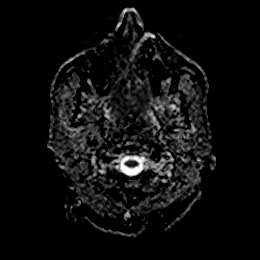
[im 18/54]
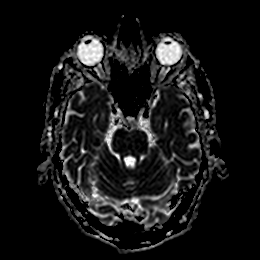
[im 36/54]
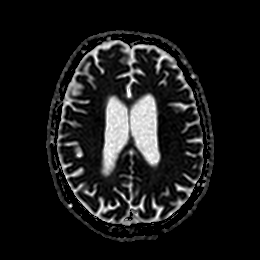
[im 54/54]
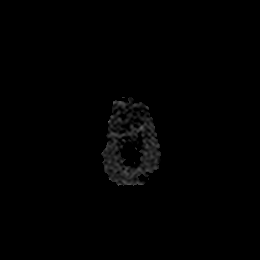

[Series 7: DWI · coronal · 4.0mm · 0.88mm/px · 6 of 78 slices shown (3 of 4)]
[im 1/78]
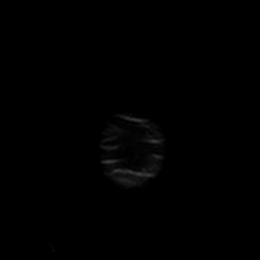
[im 16/78]
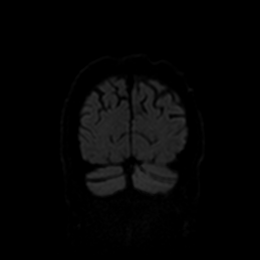
[im 31/78]
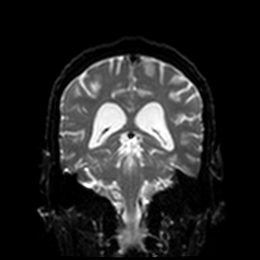
[im 47/78]
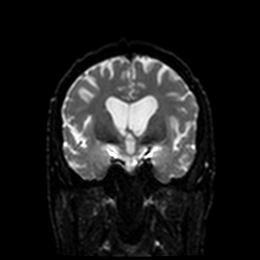
[im 62/78]
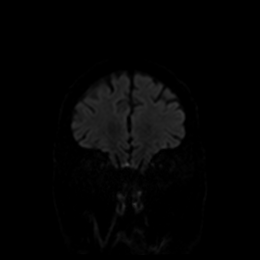
[im 78/78]
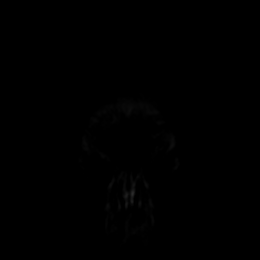

[Series 8: DWI · coronal · 4.0mm · 0.88mm/px · 3 of 39 slices shown (4 of 4)]
[im 1/39]
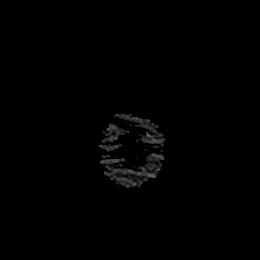
[im 20/39]
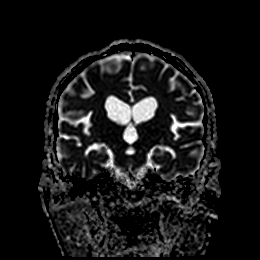
[im 39/39]
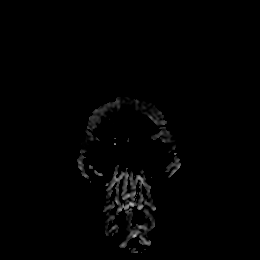

[Series 9: T1 · sagittal · 5.0mm · 0.75mm/px · 2 of 23 slices shown]
[im 1/23]
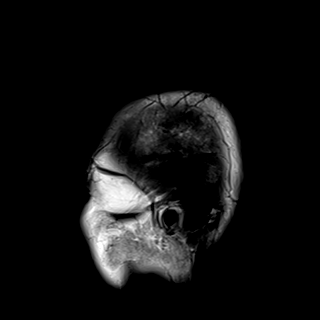
[im 23/23]
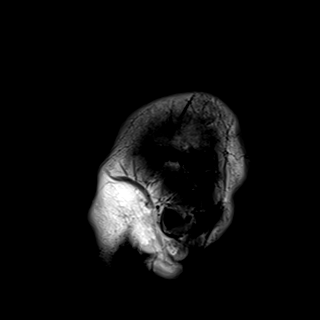

[Series 10: T2 · axial · 5.0mm · 0.72mm/px · z∈[-72,+84]mm · 2 of 27 slices shown (1 of 2)]
[im 1/27]
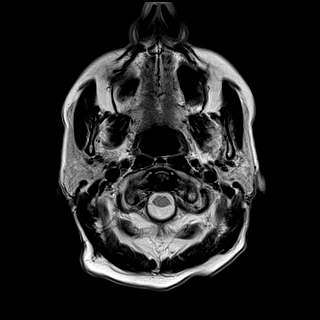
[im 27/27]
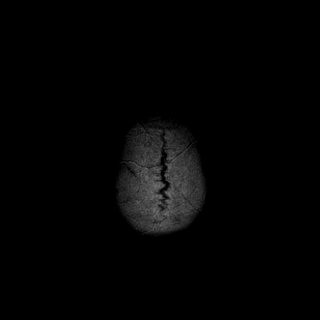

[Series 11: FLAIR · axial · 5.0mm · 0.45mm/px · z∈[-71,+84]mm · 2 of 27 slices shown]
[im 1/27]
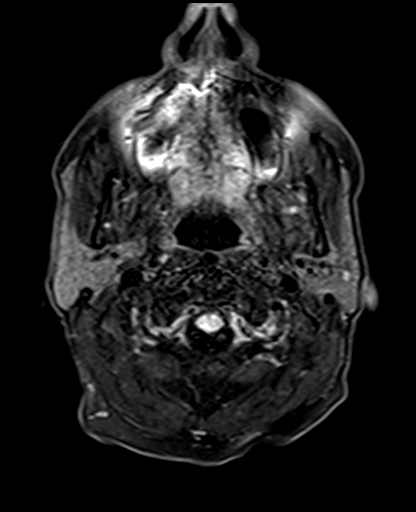
[im 27/27]
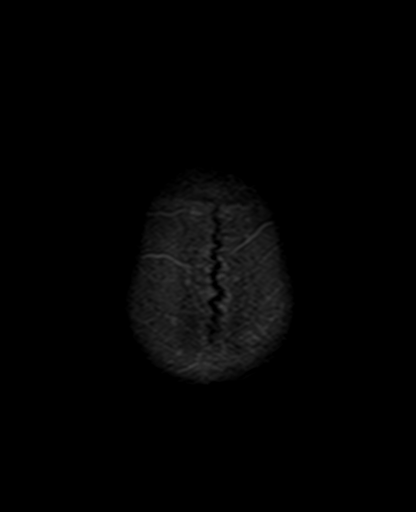

[Series 12: mag_images · axial · 3.0mm · 0.90mm/px · z∈[-76,+88]mm · 4 of 56 slices shown]
[im 1/56]
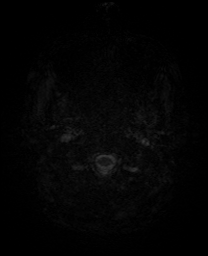
[im 19/56]
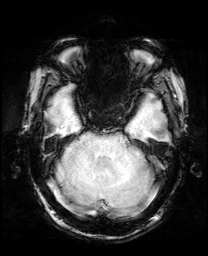
[im 37/56]
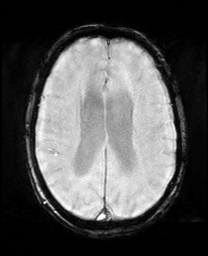
[im 56/56]
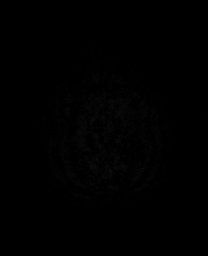

[Series 13: pha_images · axial · 3.0mm · 0.90mm/px · z∈[-76,+79]mm · 4 of 53 slices shown]
[im 1/53]
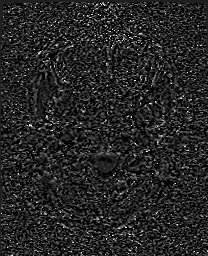
[im 18/53]
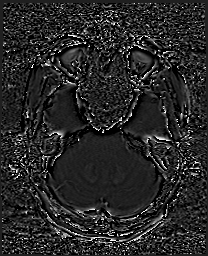
[im 35/53]
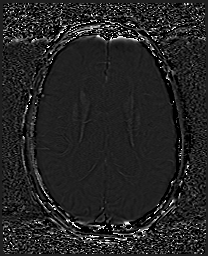
[im 53/53]
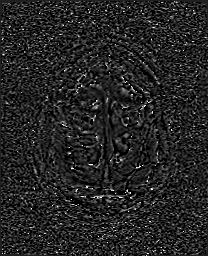

[Series 14: swi_images · axial · 3.0mm · 0.90mm/px · z∈[-76,+88]mm · 4 of 56 slices shown]
[im 1/56]
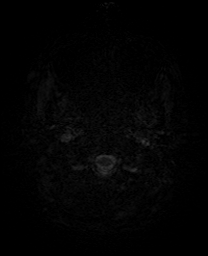
[im 19/56]
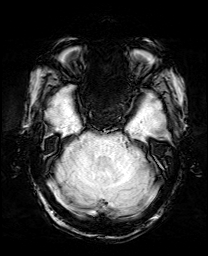
[im 37/56]
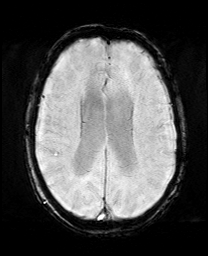
[im 56/56]
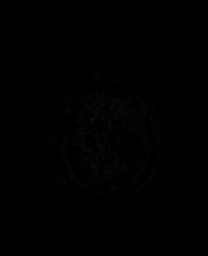

[Series 15: mip_images(sw) · axial · 24.0mm · 0.90mm/px · z∈[-65,+78]mm · 4 of 49 slices shown]
[im 1/49]
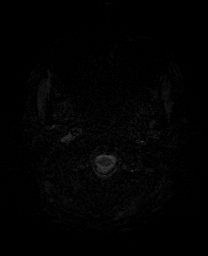
[im 17/49]
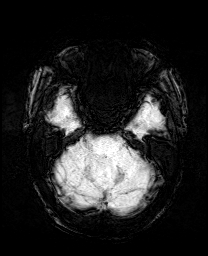
[im 33/49]
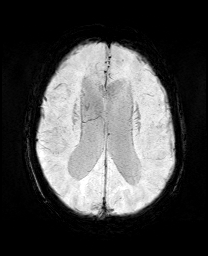
[im 49/49]
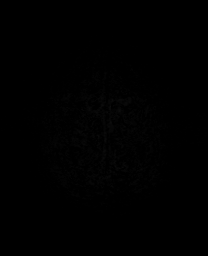

[Series 17: T2 · coronal · 5.0mm · 0.34mm/px · 2 of 32 slices shown (2 of 2)]
[im 1/32]
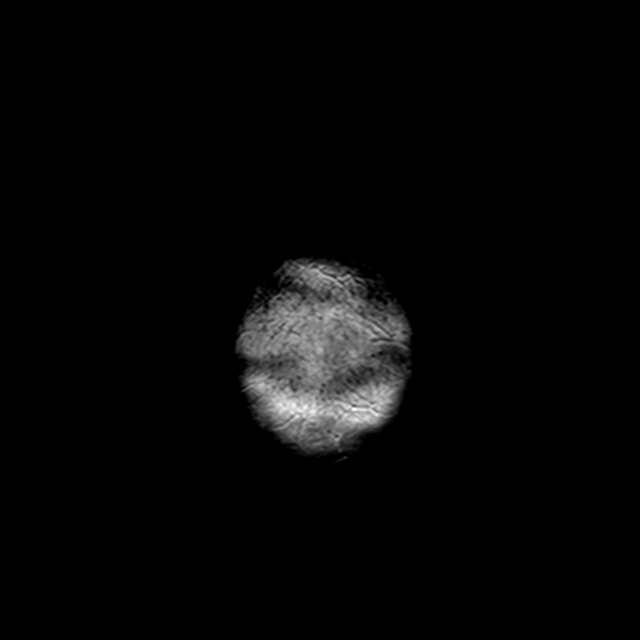
[im 32/32]
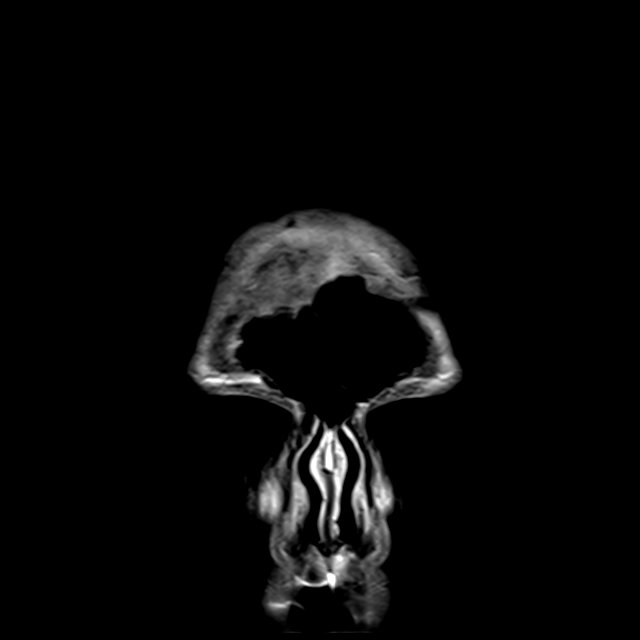

[44 of 48 positions shown; findings below may reference images not displayed]

FINDINGS: Brain: Mild age related volume loss. There is no old or acute small
or large vessel infarction, mass lesion, hemorrhage, hydrocephalus
or extra-axial collection.

Vascular: Major vessels at the base of the brain show flow.

Skull and upper cervical spine: Negative

Sinuses/Orbits: Clear/normal

Other: None
IMPRESSION: Mild age related volume loss.  No focal or acute finding.

## 2023-09-07 ENCOUNTER — Other Ambulatory Visit: Payer: Self-pay | Admitting: Neurological Surgery

## 2023-09-09 ENCOUNTER — Other Ambulatory Visit: Payer: Self-pay | Admitting: Neurological Surgery

## 2023-09-09 NOTE — Progress Notes (Addendum)
Surgical Instructions   Your procedure is scheduled on Sept 26, 2024. Report to [facility] Main Entrance "A" at 5:30 A.M., then check in with the Admitting office. Any questions or running late day of surgery: call [phone]  Questions prior to your surgery date: call [phone], Monday-Friday, 8am-4pm. If you experience any cold or flu symptoms such as cough, fever, chills, shortness of breath, etc. between now and your scheduled surgery, please notify us at the above number.     Remember:  Do not eat or drink after midnight the night before your surgery   Take these medicines the morning of surgery with A SIP OF WATER  amLODipine (NORVASC)  gabapentin (NEURONTIN)  omeprazole (PRILOSEC)  predniSONE (DELTASONE)  primidone (MYSOLINE)  SUBOXONE 8-2 MG FILM Contact provided for instructions on how to take prior to surgery  May take these medicines IF NEEDED:   One week prior to surgery, STOP taking any Aspirin (unless otherwise instructed by your surgeon) Aleve, Naproxen, Ibuprofen, Motrin, Advil, Goody's, BC's, all herbal medications, fish oil, and non-prescription vitamins. This includes your diclofenac (VOLTAREN).                     Do NOT Smoke (Tobacco/Vaping) for 24 hours prior to your procedure.  If you use a CPAP at night, you may bring your mask/headgear for your overnight stay.   You will be asked to remove any contacts, glasses, piercing's, hearing aid's, dentures/partials prior to surgery. Please bring cases for these items if needed.    Patients discharged the day of surgery will not be allowed to drive home, and someone needs to stay with them for 24 hours.  SURGICAL WAITING ROOM VISITATION Patients may have no more than 2 support people in the waiting area - these visitors may rotate.   Pre-op nurse will coordinate an appropriate time for 1 ADULT support person, who may not rotate, to accompany patient in pre-op.  Children under the age of 3 must have an  adult with them who is not the patient and must remain in the main waiting area with an adult.  If the patient needs to stay at the hospital during part of their recovery, the visitor guidelines for inpatient rooms apply.  Please refer to the [facility] website for the visitor guidelines for any additional information.   If you received a COVID test during your pre-op visit  it is requested that you wear a mask when out in public, stay away from anyone that may not be feeling well and notify your surgeon if you develop symptoms. If you have been in contact with anyone that has tested positive in the last 10 days please notify you surgeon.      Pre-operative 5 CHG Bathing Instructions   You can play a key role in reducing the risk of infection after surgery. Your skin needs to be as free of germs as possible. You can reduce the number of germs on your skin by washing with CHG (chlorhexidine gluconate) soap before surgery. CHG is an antiseptic soap that kills germs and continues to kill germs even after washing.   DO NOT use if you have an allergy to chlorhexidine/CHG or antibacterial soaps. If your skin becomes reddened or irritated, stop using the CHG and notify one of our RNs at [phone].   Please shower with the CHG soap starting 4 days before surgery using the following schedule:     Please keep in mind the following:  DO  NOT shave, including legs and underarms, starting the day of your first shower.   You may shave your face at any point before/day of surgery.  Place clean sheets on your bed the day you start using CHG soap. Use a clean washcloth (not used since being washed) for each shower. DO NOT sleep with pets once you start using the CHG.   CHG Shower Instructions:  Wash your face and private area with normal soap. If you choose to wash your hair, wash first with your normal shampoo.  After you use shampoo/soap, rinse your hair and body thoroughly to remove shampoo/soap  residue.  Turn the water OFF and apply about 3 tablespoons (45 ml) of CHG soap to a CLEAN washcloth.  Apply CHG soap ONLY FROM YOUR NECK DOWN TO YOUR TOES (washing for 3-5 minutes)  DO NOT use CHG soap on face, private areas, open wounds, or sores.  Pay special attention to the area where your surgery is being performed.  If you are having back surgery, having someone wash your back for you may be helpful. Wait 2 minutes after CHG soap is applied, then you may rinse off the CHG soap.  Pat dry with a clean towel  Put on clean clothes/pajamas   If you choose to wear lotion, please use ONLY the CHG-compatible lotions on the back of this paper.   Additional instructions for the day of surgery: DO NOT APPLY any lotions, deodorants, cologne, or perfumes.   Do not bring valuables to the hospital. [facility] is not responsible for any belongings/valuables. Do not wear nail polish, gel polish, artificial nails, or any other type of covering on natural nails (fingers and toes) Do not wear jewelry or makeup Put on clean/comfortable clothes.  Please brush your teeth.  Ask your nurse before applying any prescription medications to the skin.     CHG Compatible Lotions   Aveeno Moisturizing lotion  Cetaphil Moisturizing Cream  Cetaphil Moisturizing Lotion  Clairol Herbal Essence Moisturizing Lotion, Dry Skin  Clairol Herbal Essence Moisturizing Lotion, Extra Dry Skin  Clairol Herbal Essence Moisturizing Lotion, Normal Skin  Curel Age Defying Therapeutic Moisturizing Lotion with Alpha Hydroxy  Curel Extreme Care Body Lotion  Curel Soothing Hands Moisturizing Hand Lotion  Curel Therapeutic Moisturizing Cream, Fragrance-Free  Curel Therapeutic Moisturizing Lotion, Fragrance-Free  Curel Therapeutic Moisturizing Lotion, Original Formula  Eucerin Daily Replenishing Lotion  Eucerin Dry Skin Therapy Plus Alpha Hydroxy Crme  Eucerin Dry Skin Therapy Plus Alpha Hydroxy Lotion  Eucerin Original  Crme  Eucerin Original Lotion  Eucerin Plus Crme Eucerin Plus Lotion  Eucerin TriLipid Replenishing Lotion  Keri Anti-Bacterial Hand Lotion  Keri Deep Conditioning Original Lotion Dry Skin Formula Softly Scented  Keri Deep Conditioning Original Lotion, Fragrance Free Sensitive Skin Formula  Keri Lotion Fast Absorbing Fragrance Free Sensitive Skin Formula  Keri Lotion Fast Absorbing Softly Scented Dry Skin Formula  Keri Original Lotion  Keri Skin Renewal Lotion Keri Silky Smooth Lotion  Keri Silky Smooth Sensitive Skin Lotion  Nivea Body Creamy Conditioning Oil  Nivea Body Extra Enriched Lotion  Nivea Body Original Lotion  Nivea Body Sheer Moisturizing Lotion Nivea Crme  Nivea Skin Firming Lotion  NutraDerm 30 Skin Lotion  NutraDerm Skin Lotion  NutraDerm Therapeutic Skin Cream  NutraDerm Therapeutic Skin Lotion  ProShield Protective Hand Cream  Provon moisturizing lotion  Please read over the following fact sheets that you were given.

## 2023-09-12 ENCOUNTER — Encounter (HOSPITAL_COMMUNITY): Payer: Self-pay

## 2023-09-12 ENCOUNTER — Other Ambulatory Visit: Payer: Self-pay

## 2023-09-12 ENCOUNTER — Encounter (HOSPITAL_COMMUNITY)
Admission: RE | Admit: 2023-09-12 | Discharge: 2023-09-12 | Disposition: A | Discharge status: Home / Self Care | Payer: No Typology Code available for payment source | Source: Ambulatory Visit | Attending: Neurological Surgery | Admitting: Neurological Surgery

## 2023-09-12 VITALS — BP 142/75 | HR 71 | Temp 98.3°F | Resp 18 | Ht 73.0 in | Wt 144.1 lb

## 2023-09-12 DIAGNOSIS — Z01818 Encounter for other preprocedural examination: Secondary | ICD-10-CM

## 2023-09-12 DIAGNOSIS — Z01812 Encounter for preprocedural laboratory examination: Secondary | ICD-10-CM | POA: Insufficient documentation

## 2023-09-12 HISTORY — DX: Chronic obstructive pulmonary disease, unspecified: J44.9

## 2023-09-12 HISTORY — DX: Unspecified osteoarthritis, unspecified site: M19.90

## 2023-09-12 HISTORY — DX: Cardiac murmur, unspecified: R01.1

## 2023-09-12 LAB — CBC
HCT: 37.3 % — ABNORMAL LOW (ref 39.0–52.0)
Hemoglobin: 12 g/dL — ABNORMAL LOW (ref 13.0–17.0)
MCH: 33.6 pg (ref 26.0–34.0)
MCHC: 32.2 g/dL (ref 30.0–36.0)
MCV: 104.5 fL — ABNORMAL HIGH (ref 80.0–100.0)
Platelets: 201 10*3/uL (ref 150–400)
RBC: 3.57 MIL/uL — ABNORMAL LOW (ref 4.22–5.81)
RDW: 14.1 % (ref 11.5–15.5)
WBC: 10.8 10*3/uL — ABNORMAL HIGH (ref 4.0–10.5)
nRBC: 0 % (ref 0.0–0.2)

## 2023-09-12 LAB — BASIC METABOLIC PANEL
Anion gap: 8 (ref 5–15)
BUN: 23 mg/dL (ref 8–23)
CO2: 24 mmol/L (ref 22–32)
Calcium: 8.8 mg/dL — ABNORMAL LOW (ref 8.9–10.3)
Chloride: 105 mmol/L (ref 98–111)
Creatinine, Ser: 1.21 mg/dL (ref 0.61–1.24)
GFR, Estimated: >60 mL/min (ref >60)
Glucose, Bld: 128 mg/dL — ABNORMAL HIGH (ref 70–99)
Potassium: 5.2 mmol/L — ABNORMAL HIGH (ref 3.5–5.1)
Sodium: 137 mmol/L (ref 135–145)

## 2023-09-12 LAB — TYPE AND SCREEN
ABO/RH(D): A POS
Antibody Screen: NEGATIVE

## 2023-09-12 LAB — SURGICAL PCR SCREEN
MRSA, PCR: NEGATIVE
Staphylococcus aureus: NEGATIVE

## 2023-09-12 NOTE — Progress Notes (Addendum)
PCP - Margret Chance Cardiologist - denies  PPM/ICD - denies Device Orders - n/a Rep Notified - n/a  Chest x-ray - 04-25-22 EKG - requesting records Stress Test -  ECHO - requesting records Cardiac Cath -   Sleep Study - denies CPAP -n/a   DM denies  Blood Thinner Instructions: Denies Aspirin Instructions:n/a  ERAS Protcol - NPO   COVID TEST- n/a   Anesthesia review: Yes hx of heart murmur and requesting records Did ask patient to follow up with provider and instructions for suboxone, did become upset stated " I am not stopping it".  Patient denies shortness of breath, fever, cough and chest pain at PAT appointment   All instructions explained to the patient, with a verbal understanding of the material. Patient agrees to go over the instructions while at home for a better understanding. Patient also instructed to self quarantine after being tested for COVID-19. The opportunity to ask questions was provided.

## 2023-09-13 ENCOUNTER — Encounter (HOSPITAL_COMMUNITY): Payer: Self-pay

## 2023-09-13 NOTE — Progress Notes (Signed)
Anesthesia Chart Review:  Case: [id number] Date/Time: 09/15/23 0715   Procedure: OPEN LUMBAR LAMINECTOMY, FORAMINOTOMY L45, L5S1 - 3C   Anesthesia type: General   Pre-op diagnosis: LUMBAR STENOSIS WITH NEUROGENIC CLAUDICATION   Location: [facility] OR ROOM 20 / [facility] OR   Surgeons: Dawley, Alan Mulder, DO       DISCUSSION: Patient is a 77 year old male scheduled for the above procedure.   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 [facility] notes also mention hepatitis C.   He is followed by cardiologist Dr. 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 Last visit 01/21/23 (see Care Everywhere and notes on shadow chart). He was last evaluated on 01/21/23. 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 Sruveillience planned in 3 years, approximately in January 2025. Murrur was described as "Very soft systolic murmur likely flow related." HTN controlled. Encouraged smoking cessation. He did order a chest CTA to rule out aortopathy associated with presumed bicuspid AV. CTA was done on 02/22/23 and showed the thoracic aorta was within normal limits of size.   He says he will need to continue Suboxone perioperatively. He is on prednisone 60 mg daily, presumably for RA history (no rheumatology records currently available).   Anesthesia team to evaluate on the day of surgery.     VS: BP (!) 142/75   Pulse 71   Temp 36.8 C   Resp 18   Ht 6\' 1"  (1.854 m)   Wt 65.4 kg   SpO2 97%   BMI 19.01 kg/m    PROVIDERS: He receives care through the [place], [place] Laural Golden, MD is cardiologist [phone] [facility]) Darlina Sicilian, MD is [facility] neurologist. Last visit 03/03/23 for essential tremor. Given a years supply of band for  CalaTrio device. He had [facility] evaluation with neurosurgeon Pola Corn, MD for consideration of a MRI guided Ultrasound Thalamotomy. Per 04/05/23 notation, "unfortunately, his SDR was too low to qualify for FUS. It was 0.33 and needs to be at least 0.40 to qualify. He was disappointed and is going to think about whether or not he wants to proceed with a DBS."    LABS: Labs reviewed: Acceptable for surgery. K 5.2, no mention of hemolysis. Creatinine 1.21.  (all labs ordered are listed, but only abnormal results are displayed)  Labs Reviewed  CBC - Abnormal; Notable for the following components:      Result Value   WBC 10.8 (*)    RBC 3.57 (*)    Hemoglobin 12.0 (*)    HCT 37.3 (*)    MCV 104.5 (*)    All other components within normal limits  BASIC METABOLIC PANEL - Abnormal; Notable for the following components:   Potassium 5.2 (*)    Glucose, Bld 128 (*)    Calcium 8.8 (*)    All other components within normal limits  SURGICAL PCR SCREEN  TYPE AND SCREEN     IMAGES: CT Head 03/29/23: IMPRESSION: Mild supratentorial ventriculomegaly. No other significant intracranial abnormality.   CTA Chest 02/22/23 ([facility]): IMPRESSION: Mild emphysematous changes. Mild interstitial disease. Small right pleural effusion. No pulmonary nodules.    EKG: 07/10/23 ([facility]): Sinus bradycardia at 54 bpm. Possible anterior infarct, age indeterminate. No  significant change from prior EKG (per interpreting provider Sabitha Vasireddy,MD)   CV: LE Arterial US 07/10/23 ([facility]): IMPRESSION: Bilaterally no sonographic evidence of significant lower extremity arterial stenosis.  Echo 01/15/21 ([facility]): IMPRESSION: Normal LV size with normal systolic function.  The ejection fraction is 61%.  There is mildly increased left ventricular wall thickness.  LV diastolic relaxation is well-preserved with normal resting LV filling pressure, normal LAVi 20, and normal RVSP. The RV size and  function is normal.  Normal RA pressure. This is most likely bicuspid aortic valve with asymmetric calcifications predominantly atr thecc and mild associated AS, mAVG 13 mmHg, V-mas 2.4 m/s, no AI, DVI 0.45. There is mild tricuspid regurgitation. Normal pulmonary artery systolic pressure.  PASP 30 mmHg.   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, mean grad 13 mmHg 01/15/21 ([facility])   Hypertension     Past Surgical History:  Procedure Laterality Date   HIP SURGERY      MEDICATIONS:  amLODipine (NORVASC) 10 MG tablet   Cholecalciferol (VITAMIN D3) 50 MCG (2000 UT) TABS   cyanocobalamin (VITAMIN B12) 1000 MCG tablet   diclofenac (VOLTAREN) 50 MG EC tablet   folic acid (FOLVITE) 1 MG tablet   gabapentin (NEURONTIN) 300 MG capsule   lisinopril (PRINIVIL,ZESTRIL) 20 MG tablet   Omega-3 Fatty Acids (FISH OIL) 1200 MG CAPS   omeprazole (PRILOSEC) 20 MG capsule   predniSONE (DELTASONE) 20 MG tablet   primidone (MYSOLINE) 50 MG tablet   QUEtiapine (SEROQUEL) 200 MG tablet   SUBOXONE 8-2 MG FILM   No current facility-administered medications for this encounter.   Shonna Chock, PA-C Surgical Short Stay/Anesthesiology [facility] Phone [phone] [facility] Phone [phone] 09/14/2023 9:17 AM

## 2023-09-14 NOTE — Anesthesia Preprocedure Evaluation (Addendum)
Anesthesia Evaluation  Patient identified by MRN, date of birth, ID band Patient awake    Reviewed: Allergy & Precautions, NPO status , Patient's Chart, lab work & pertinent test results  Airway Mallampati: II  TM Distance: >3 FB Neck ROM: Limited    Dental  (+) Dental Advisory Given, Teeth Intact, Missing   Pulmonary COPD, Current Smoker   Pulmonary exam normal breath sounds clear to auscultation       Cardiovascular hypertension, Pt. on medications Normal cardiovascular exam+ Valvular Problems/Murmurs AS  Rhythm:Regular Rate:Normal     Neuro/Psych Seizures -,  PSYCHIATRIC DISORDERS   Bipolar Disorder      GI/Hepatic Neg liver ROS,GERD  ,,  Endo/Other  negative endocrine ROS    Renal/GU Renal disease     Musculoskeletal  (+) Arthritis ,    Abdominal   Peds  Hematology  (+) Blood dyscrasia, anemia   Anesthesia Other Findings   Reproductive/Obstetrics                             Anesthesia Physical Anesthesia Plan  ASA: 4  Anesthesia Plan: General   Post-op Pain Management: Tylenol PO (pre-op)* and Gabapentin PO (pre-op)*   Induction: Intravenous  PONV Risk Score and Plan: 2 and Ondansetron, Dexamethasone and Treatment may vary due to age or medical condition  Airway Management Planned: Oral ETT  Additional Equipment:   Intra-op Plan:   Post-operative Plan: Extubation in OR  Informed Consent: I have reviewed the patients History and Physical, chart, labs and discussed the procedure including the risks, benefits and alternatives for the proposed anesthesia with the patient or authorized representative who has indicated his/her understanding and acceptance.     Dental advisory given  Plan Discussed with: CRNA  Anesthesia Plan Comments: (+/- aline vs clearsight  PAT note written 09/14/2023 by Shonna Chock, PA-C.  )       Anesthesia Quick Evaluation

## 2023-09-15 ENCOUNTER — Other Ambulatory Visit: Payer: Self-pay

## 2023-09-15 ENCOUNTER — Ambulatory Visit (HOSPITAL_COMMUNITY): Payer: Non-veteran care

## 2023-09-15 ENCOUNTER — Encounter (HOSPITAL_COMMUNITY): Payer: Self-pay | Admitting: Neurological Surgery

## 2023-09-15 ENCOUNTER — Ambulatory Visit (HOSPITAL_COMMUNITY): Payer: No Typology Code available for payment source | Admitting: Vascular Surgery

## 2023-09-15 ENCOUNTER — Observation Stay (HOSPITAL_COMMUNITY)
Admission: RE | Admit: 2023-09-15 | Discharge: 2023-09-16 | Disposition: A | Discharge status: Home / Self Care | Payer: No Typology Code available for payment source | Attending: Neurological Surgery | Admitting: Neurological Surgery

## 2023-09-15 ENCOUNTER — Ambulatory Visit (HOSPITAL_BASED_OUTPATIENT_CLINIC_OR_DEPARTMENT_OTHER): Payer: No Typology Code available for payment source | Admitting: Anesthesiology

## 2023-09-15 ENCOUNTER — Encounter (HOSPITAL_COMMUNITY)
Admission: RE | Disposition: A | Discharge status: Home / Self Care | Payer: Self-pay | Source: Home / Self Care | Attending: Neurological Surgery

## 2023-09-15 DIAGNOSIS — M4807 Spinal stenosis, lumbosacral region: Principal | ICD-10-CM | POA: Insufficient documentation

## 2023-09-15 DIAGNOSIS — I10 Essential (primary) hypertension: Secondary | ICD-10-CM | POA: Insufficient documentation

## 2023-09-15 DIAGNOSIS — J449 Chronic obstructive pulmonary disease, unspecified: Secondary | ICD-10-CM | POA: Insufficient documentation

## 2023-09-15 DIAGNOSIS — M48061 Spinal stenosis, lumbar region without neurogenic claudication: Principal | ICD-10-CM | POA: Diagnosis present

## 2023-09-15 DIAGNOSIS — M48062 Spinal stenosis, lumbar region with neurogenic claudication: Secondary | ICD-10-CM

## 2023-09-15 DIAGNOSIS — M5417 Radiculopathy, lumbosacral region: Secondary | ICD-10-CM | POA: Diagnosis not present

## 2023-09-15 DIAGNOSIS — F17210 Nicotine dependence, cigarettes, uncomplicated: Secondary | ICD-10-CM | POA: Diagnosis not present

## 2023-09-15 DIAGNOSIS — Z79899 Other long term (current) drug therapy: Secondary | ICD-10-CM | POA: Insufficient documentation

## 2023-09-15 HISTORY — PX: LUMBAR LAMINECTOMY/DECOMPRESSION MICRODISCECTOMY: SHX5026

## 2023-09-15 LAB — ABO/RH: ABO/RH(D): A POS

## 2023-09-15 SURGERY — LUMBAR LAMINECTOMY/DECOMPRESSION MICRODISCECTOMY 2 LEVELS
Anesthesia: General

## 2023-09-15 MED ORDER — GABAPENTIN 300 MG PO CAPS
600.0000 mg | ORAL_CAPSULE | Freq: Three times a day (TID) | ORAL | Status: DC
  Administered 2023-09-15 (×2): 600 mg via ORAL
  Filled 2023-09-15 (×2): qty 2

## 2023-09-15 MED ORDER — PRIMIDONE 50 MG PO TABS
50.0000 mg | ORAL_TABLET | Freq: Two times a day (BID) | ORAL | Status: DC
  Administered 2023-09-15: 50 mg via ORAL
  Filled 2023-09-15 (×2): qty 1

## 2023-09-15 MED ORDER — LISINOPRIL 20 MG PO TABS
20.0000 mg | ORAL_TABLET | Freq: Every morning | ORAL | Status: DC
  Administered 2023-09-16: 20 mg via ORAL
  Filled 2023-09-15: qty 1

## 2023-09-15 MED ORDER — PHENYLEPHRINE 80 MCG/ML (10ML) SYRINGE FOR IV PUSH (FOR BLOOD PRESSURE SUPPORT)
PREFILLED_SYRINGE | INTRAVENOUS | Status: DC | PRN
  Administered 2023-09-15 (×2): 80 ug via INTRAVENOUS

## 2023-09-15 MED ORDER — SODIUM CHLORIDE 0.9% FLUSH
3.0000 mL | Freq: Two times a day (BID) | INTRAVENOUS | Status: DC
  Administered 2023-09-15: 3 mL via INTRAVENOUS

## 2023-09-15 MED ORDER — SODIUM CHLORIDE 0.9 % IV SOLN: 250.0000 mL | INTRAVENOUS | Status: DC

## 2023-09-15 MED ORDER — LIDOCAINE 2% (20 MG/ML) 5 ML SYRINGE
INTRAMUSCULAR | Status: AC
  Filled 2023-09-15: qty 5

## 2023-09-15 MED ORDER — LIDOCAINE-EPINEPHRINE 1 %-1:100000 IJ SOLN
INTRAMUSCULAR | Status: AC
  Filled 2023-09-15: qty 1

## 2023-09-15 MED ORDER — OXYCODONE HCL 5 MG PO TABS
10.0000 mg | ORAL_TABLET | ORAL | Status: DC | PRN
  Administered 2023-09-15: 10 mg via ORAL
  Filled 2023-09-15: qty 2

## 2023-09-15 MED ORDER — FENTANYL CITRATE (PF) 100 MCG/2ML IJ SOLN
INTRAMUSCULAR | Status: AC
  Filled 2023-09-15: qty 2

## 2023-09-15 MED ORDER — FENTANYL CITRATE (PF) 100 MCG/2ML IJ SOLN
INTRAMUSCULAR | Status: DC | PRN
  Administered 2023-09-15: 50 ug via INTRAVENOUS

## 2023-09-15 MED ORDER — METHOCARBAMOL 500 MG PO TABS
500.0000 mg | ORAL_TABLET | Freq: Four times a day (QID) | ORAL | Status: DC | PRN
  Administered 2023-09-15 (×2): 500 mg via ORAL
  Filled 2023-09-15 (×2): qty 1

## 2023-09-15 MED ORDER — QUETIAPINE FUMARATE 200 MG PO TABS
200.0000 mg | ORAL_TABLET | Freq: Every day | ORAL | Status: DC
  Administered 2023-09-15: 200 mg via ORAL
  Filled 2023-09-15: qty 1

## 2023-09-15 MED ORDER — ORAL CARE MOUTH RINSE: 15.0000 mL | Freq: Once | OROMUCOSAL | Status: AC

## 2023-09-15 MED ORDER — AMLODIPINE BESYLATE 10 MG PO TABS: 10.0000 mg | ORAL_TABLET | Freq: Every day | ORAL | Status: DC

## 2023-09-15 MED ORDER — LIDOCAINE-EPINEPHRINE 1 %-1:100000 IJ SOLN
INTRAMUSCULAR | Status: DC | PRN
  Administered 2023-09-15: 5 mL

## 2023-09-15 MED ORDER — CHLORHEXIDINE GLUCONATE CLOTH 2 % EX PADS: 6.0000 | MEDICATED_PAD | Freq: Once | CUTANEOUS | Status: DC

## 2023-09-15 MED ORDER — BUPIVACAINE-EPINEPHRINE (PF) 0.5% -1:200000 IJ SOLN
INTRAMUSCULAR | Status: DC | PRN
  Administered 2023-09-15: 5 mL via PERINEURAL

## 2023-09-15 MED ORDER — THROMBIN 5000 UNITS EX SOLR
OROMUCOSAL | Status: DC | PRN
  Administered 2023-09-15: 5 mL via TOPICAL

## 2023-09-15 MED ORDER — OXYCODONE HCL 5 MG PO TABS: 5.0000 mg | ORAL_TABLET | ORAL | Status: DC | PRN

## 2023-09-15 MED ORDER — PROPOFOL 10 MG/ML IV BOLUS
INTRAVENOUS | Status: AC
  Filled 2023-09-15: qty 20

## 2023-09-15 MED ORDER — PREDNISONE 50 MG PO TABS
60.0000 mg | ORAL_TABLET | Freq: Every day | ORAL | Status: DC
  Administered 2023-09-16: 60 mg via ORAL
  Filled 2023-09-15: qty 1

## 2023-09-15 MED ORDER — ONDANSETRON HCL 4 MG/2ML IJ SOLN
INTRAMUSCULAR | Status: DC | PRN
  Administered 2023-09-15: 4 mg via INTRAVENOUS

## 2023-09-15 MED ORDER — LACTATED RINGERS IV SOLN: INTRAVENOUS | Status: DC

## 2023-09-15 MED ORDER — EPHEDRINE SULFATE-NACL 50-0.9 MG/10ML-% IV SOSY
PREFILLED_SYRINGE | INTRAVENOUS | Status: DC | PRN
  Administered 2023-09-15: 10 mg via INTRAVENOUS

## 2023-09-15 MED ORDER — FENTANYL CITRATE (PF) 250 MCG/5ML IJ SOLN
INTRAMUSCULAR | Status: AC
  Filled 2023-09-15: qty 5

## 2023-09-15 MED ORDER — HEMOSTATIC AGENTS (NO CHARGE) OPTIME
TOPICAL | Status: DC | PRN
  Administered 2023-09-15: 1 via TOPICAL

## 2023-09-15 MED ORDER — KETOROLAC TROMETHAMINE 15 MG/ML IJ SOLN
7.5000 mg | Freq: Four times a day (QID) | INTRAMUSCULAR | Status: DC
  Administered 2023-09-15 (×2): 7.5 mg via INTRAVENOUS
  Filled 2023-09-15 (×3): qty 1

## 2023-09-15 MED ORDER — CEFAZOLIN SODIUM-DEXTROSE 2-4 GM/100ML-% IV SOLN
2.0000 g | INTRAVENOUS | Status: AC
  Administered 2023-09-15: 2 g via INTRAVENOUS
  Filled 2023-09-15: qty 100

## 2023-09-15 MED ORDER — DOCUSATE SODIUM 100 MG PO CAPS
100.0000 mg | ORAL_CAPSULE | Freq: Two times a day (BID) | ORAL | Status: DC
  Administered 2023-09-15 (×2): 100 mg via ORAL
  Filled 2023-09-15 (×2): qty 1

## 2023-09-15 MED ORDER — DROPERIDOL 2.5 MG/ML IJ SOLN: 0.6250 mg | Freq: Once | INTRAMUSCULAR | Status: DC | PRN

## 2023-09-15 MED ORDER — ACETAMINOPHEN 650 MG RE SUPP: 650.0000 mg | RECTAL | Status: DC | PRN

## 2023-09-15 MED ORDER — BUPIVACAINE-EPINEPHRINE (PF) 0.5% -1:200000 IJ SOLN
INTRAMUSCULAR | Status: AC
  Filled 2023-09-15: qty 30

## 2023-09-15 MED ORDER — PHENYLEPHRINE HCL-NACL 20-0.9 MG/250ML-% IV SOLN
INTRAVENOUS | Status: DC | PRN
  Administered 2023-09-15: 20 ug/min via INTRAVENOUS

## 2023-09-15 MED ORDER — 0.9 % SODIUM CHLORIDE (POUR BTL) OPTIME
TOPICAL | Status: DC | PRN
  Administered 2023-09-15: 1000 mL

## 2023-09-15 MED ORDER — ACETAMINOPHEN 500 MG PO TABS
1000.0000 mg | ORAL_TABLET | Freq: Once | ORAL | Status: AC
  Administered 2023-09-15: 1000 mg via ORAL
  Filled 2023-09-15: qty 2

## 2023-09-15 MED ORDER — SODIUM CHLORIDE 0.9 % IV SOLN: INTRAVENOUS | Status: DC

## 2023-09-15 MED ORDER — DEXAMETHASONE SODIUM PHOSPHATE 10 MG/ML IJ SOLN
INTRAMUSCULAR | Status: AC
  Filled 2023-09-15: qty 1

## 2023-09-15 MED ORDER — ONDANSETRON HCL 4 MG/2ML IJ SOLN
INTRAMUSCULAR | Status: AC
  Filled 2023-09-15: qty 2

## 2023-09-15 MED ORDER — CHLORHEXIDINE GLUCONATE 0.12 % MT SOLN
15.0000 mL | Freq: Once | OROMUCOSAL | Status: AC
  Administered 2023-09-15: 15 mL via OROMUCOSAL
  Filled 2023-09-15: qty 15

## 2023-09-15 MED ORDER — MENTHOL 3 MG MT LOZG: 1.0000 | LOZENGE | OROMUCOSAL | Status: DC | PRN

## 2023-09-15 MED ORDER — BUPIVACAINE LIPOSOME 1.3 % IJ SUSP
INTRAMUSCULAR | Status: DC | PRN
  Administered 2023-09-15: 20 mL

## 2023-09-15 MED ORDER — PHENOL 1.4 % MT LIQD: 1.0000 | OROMUCOSAL | Status: DC | PRN

## 2023-09-15 MED ORDER — GABAPENTIN 300 MG PO CAPS
300.0000 mg | ORAL_CAPSULE | Freq: Once | ORAL | Status: DC
  Filled 2023-09-15: qty 1

## 2023-09-15 MED ORDER — HYDROMORPHONE HCL 1 MG/ML IJ SOLN
INTRAMUSCULAR | Status: AC
  Filled 2023-09-15: qty 0.5

## 2023-09-15 MED ORDER — SODIUM CHLORIDE 0.9% FLUSH: 3.0000 mL | INTRAVENOUS | Status: DC | PRN

## 2023-09-15 MED ORDER — BUPIVACAINE LIPOSOME 1.3 % IJ SUSP
INTRAMUSCULAR | Status: AC
  Filled 2023-09-15: qty 20

## 2023-09-15 MED ORDER — SUGAMMADEX SODIUM 200 MG/2ML IV SOLN
INTRAVENOUS | Status: DC | PRN
  Administered 2023-09-15: 200 mg via INTRAVENOUS

## 2023-09-15 MED ORDER — ROCURONIUM BROMIDE 10 MG/ML (PF) SYRINGE
PREFILLED_SYRINGE | INTRAVENOUS | Status: DC | PRN
  Administered 2023-09-15: 60 mg via INTRAVENOUS
  Administered 2023-09-15: 20 mg via INTRAVENOUS

## 2023-09-15 MED ORDER — ONDANSETRON HCL 4 MG PO TABS: 4.0000 mg | ORAL_TABLET | Freq: Four times a day (QID) | ORAL | Status: DC | PRN

## 2023-09-15 MED ORDER — BUPRENORPHINE HCL-NALOXONE HCL 8-2 MG SL SUBL
1.0000 | SUBLINGUAL_TABLET | Freq: Three times a day (TID) | SUBLINGUAL | Status: DC
  Administered 2023-09-15 – 2023-09-16 (×3): 1 via SUBLINGUAL
  Filled 2023-09-15 (×3): qty 1

## 2023-09-15 MED ORDER — METHYLPREDNISOLONE ACETATE 80 MG/ML IJ SUSP
INTRAMUSCULAR | Status: AC
  Filled 2023-09-15: qty 1

## 2023-09-15 MED ORDER — PHENYLEPHRINE 80 MCG/ML (10ML) SYRINGE FOR IV PUSH (FOR BLOOD PRESSURE SUPPORT)
PREFILLED_SYRINGE | INTRAVENOUS | Status: AC
  Filled 2023-09-15: qty 10

## 2023-09-15 MED ORDER — METHYLPREDNISOLONE ACETATE 80 MG/ML IJ SUSP
INTRAMUSCULAR | Status: DC | PRN
  Administered 2023-09-15: 40 mL

## 2023-09-15 MED ORDER — FENTANYL CITRATE (PF) 250 MCG/5ML IJ SOLN
INTRAMUSCULAR | Status: DC | PRN
  Administered 2023-09-15: 100 ug via INTRAVENOUS

## 2023-09-15 MED ORDER — THROMBIN 5000 UNITS EX SOLR
CUTANEOUS | Status: AC
  Filled 2023-09-15: qty 5000

## 2023-09-15 MED ORDER — METHOCARBAMOL 1000 MG/10ML IJ SOLN: 500.0000 mg | Freq: Four times a day (QID) | INTRAVENOUS | Status: DC | PRN

## 2023-09-15 MED ORDER — HYDROMORPHONE HCL 1 MG/ML IJ SOLN: 0.2500 mg | INTRAMUSCULAR | Status: DC | PRN

## 2023-09-15 MED ORDER — MICROFIBRILLAR COLL HEMOSTAT EX PADS
MEDICATED_PAD | CUTANEOUS | Status: DC | PRN
  Administered 2023-09-15: 1 via TOPICAL

## 2023-09-15 MED ORDER — PROPOFOL 10 MG/ML IV BOLUS
INTRAVENOUS | Status: DC | PRN
  Administered 2023-09-15: 90 mg via INTRAVENOUS

## 2023-09-15 MED ORDER — ROCURONIUM BROMIDE 10 MG/ML (PF) SYRINGE
PREFILLED_SYRINGE | INTRAVENOUS | Status: AC
  Filled 2023-09-15: qty 10

## 2023-09-15 MED ORDER — HYDROMORPHONE HCL 1 MG/ML IJ SOLN
0.5000 mg | INTRAMUSCULAR | Status: DC | PRN
  Administered 2023-09-15: 0.5 mg via INTRAVENOUS
  Filled 2023-09-15: qty 0.5

## 2023-09-15 MED ORDER — DEXAMETHASONE SODIUM PHOSPHATE 10 MG/ML IJ SOLN
INTRAMUSCULAR | Status: DC | PRN
  Administered 2023-09-15: 10 mg via INTRAVENOUS

## 2023-09-15 MED ORDER — CEFAZOLIN SODIUM-DEXTROSE 2-4 GM/100ML-% IV SOLN
2.0000 g | Freq: Three times a day (TID) | INTRAVENOUS | Status: AC
  Administered 2023-09-15 (×2): 2 g via INTRAVENOUS
  Filled 2023-09-15 (×2): qty 100

## 2023-09-15 MED ORDER — EPHEDRINE 5 MG/ML INJ
INTRAVENOUS | Status: AC
  Filled 2023-09-15: qty 5

## 2023-09-15 MED ORDER — PANTOPRAZOLE SODIUM 40 MG PO TBEC: 40.0000 mg | DELAYED_RELEASE_TABLET | Freq: Every day | ORAL | Status: DC

## 2023-09-15 MED ORDER — ACETAMINOPHEN 325 MG PO TABS: 650.0000 mg | ORAL_TABLET | ORAL | Status: DC | PRN

## 2023-09-15 MED ORDER — LIDOCAINE 2% (20 MG/ML) 5 ML SYRINGE
INTRAMUSCULAR | Status: DC | PRN
  Administered 2023-09-15: 60 mg via INTRAVENOUS

## 2023-09-15 MED ORDER — ONDANSETRON HCL 4 MG/2ML IJ SOLN: 4.0000 mg | Freq: Four times a day (QID) | INTRAMUSCULAR | Status: DC | PRN

## 2023-09-15 SURGICAL SUPPLY — 62 items
ADH SKN CLS APL DERMABOND .7 (GAUZE/BANDAGES/DRESSINGS) ×1
BAG COUNTER SPONGE SURGICOUNT (BAG) ×1 IMPLANT
BAG SPNG CNTER NS LX DISP (BAG) ×1
BUR CARBIDE MATCH 3.0 (BURR) ×1 IMPLANT
CNTNR URN SCR LID CUP LEK RST (MISCELLANEOUS) ×1 IMPLANT
CONT SPEC 4OZ STRL OR WHT (MISCELLANEOUS) ×1
COVER MAYO STAND STRL (DRAPES) ×1 IMPLANT
DERMABOND ADVANCED .7 DNX12 (GAUZE/BANDAGES/DRESSINGS) IMPLANT
DRAIN JACKSON RD 7FR 3/32 (WOUND CARE) IMPLANT
DRAPE C-ARM 42X72 X-RAY (DRAPES) ×1 IMPLANT
DRAPE LAPAROTOMY 100X72X124 (DRAPES) ×1 IMPLANT
DRAPE MICROSCOPE SLANT 54X150 (MISCELLANEOUS) ×1 IMPLANT
DRAPE SURG 17X23 STRL (DRAPES) ×1 IMPLANT
DRSG OPSITE POSTOP 4X6 (GAUZE/BANDAGES/DRESSINGS) IMPLANT
DURAPREP 26ML APPLICATOR (WOUND CARE) ×1 IMPLANT
ELECT BLADE INSULATED 4IN (ELECTROSURGICAL) ×1
ELECT COATED BLADE 2.86 ST (ELECTRODE) ×1 IMPLANT
ELECT REM PT RETURN 9FT ADLT (ELECTROSURGICAL) ×1
ELECTRODE BLADE INSULATED 4IN (ELECTROSURGICAL) ×1 IMPLANT
ELECTRODE REM PT RTRN 9FT ADLT (ELECTROSURGICAL) ×1 IMPLANT
EVACUATOR 1/8 PVC DRAIN (DRAIN) IMPLANT
GAUZE 4X4 16PLY ~~LOC~~+RFID DBL (SPONGE) IMPLANT
GAUZE SPONGE 4X4 12PLY STRL (GAUZE/BANDAGES/DRESSINGS) ×1 IMPLANT
GLOVE BIO SURGEON STRL SZ7 (GLOVE) ×1 IMPLANT
GLOVE BIOGEL PI IND STRL 7.5 (GLOVE) ×1 IMPLANT
GLOVE BIOGEL PI IND STRL 8 (GLOVE) ×1 IMPLANT
GLOVE BIOGEL PI IND STRL 8.5 (GLOVE) IMPLANT
GLOVE ECLIPSE 8.0 STRL XLNG CF (GLOVE) ×2 IMPLANT
GLOVE ECLIPSE 8.5 STRL (GLOVE) IMPLANT
GOWN STRL REUS W/ TWL LRG LVL3 (GOWN DISPOSABLE) IMPLANT
GOWN STRL REUS W/ TWL XL LVL3 (GOWN DISPOSABLE) ×2 IMPLANT
GOWN STRL REUS W/TWL 2XL LVL3 (GOWN DISPOSABLE) IMPLANT
GOWN STRL REUS W/TWL LRG LVL3 (GOWN DISPOSABLE) ×1
GOWN STRL REUS W/TWL XL LVL3 (GOWN DISPOSABLE) ×2
HEMOSTAT POWDER KIT SURGIFOAM (HEMOSTASIS) ×1 IMPLANT
HEMOSTAT SURGICEL 2X4 FIBR (HEMOSTASIS) IMPLANT
KIT BASIN OR (CUSTOM PROCEDURE TRAY) ×1 IMPLANT
KIT POSITION SURG JACKSON T1 (MISCELLANEOUS) ×1 IMPLANT
KIT TURNOVER KIT B (KITS) ×1 IMPLANT
MARKER SKIN DUAL TIP RULER LAB (MISCELLANEOUS) ×1 IMPLANT
NDL HYPO 18GX1.5 BLUNT FILL (NEEDLE) IMPLANT
NDL HYPO 25X1 1.5 SAFETY (NEEDLE) ×1 IMPLANT
NEEDLE HYPO 18GX1.5 BLUNT FILL (NEEDLE) ×1 IMPLANT
NEEDLE HYPO 25X1 1.5 SAFETY (NEEDLE) ×1 IMPLANT
NS IRRIG 1000ML POUR BTL (IV SOLUTION) ×1 IMPLANT
PACK LAMINECTOMY NEURO (CUSTOM PROCEDURE TRAY) ×1 IMPLANT
PAD ARMBOARD 7.5X6 YLW CONV (MISCELLANEOUS) ×3 IMPLANT
PATTIES SURGICAL .5 X.5 (GAUZE/BANDAGES/DRESSINGS) IMPLANT
PATTIES SURGICAL .5 X1 (DISPOSABLE) IMPLANT
PATTIES SURGICAL 1X1 (DISPOSABLE) IMPLANT
SPIKE FLUID TRANSFER (MISCELLANEOUS) ×1 IMPLANT
SPONGE SURGIFOAM ABS GEL SZ50 (HEMOSTASIS) ×1 IMPLANT
SPONGE T-LAP 4X18 ~~LOC~~+RFID (SPONGE) IMPLANT
STAPLER VISISTAT 35W (STAPLE) IMPLANT
SUT VIC AB 0 CT1 18XCR BRD8 (SUTURE) ×1 IMPLANT
SUT VIC AB 0 CT1 8-18 (SUTURE) ×1
SUT VIC AB 2-0 CP2 18 (SUTURE) ×1 IMPLANT
SUT VIC AB 3-0 SH 8-18 (SUTURE) ×1 IMPLANT
SYR 5ML LL (SYRINGE) IMPLANT
TOWEL GREEN STERILE (TOWEL DISPOSABLE) ×1 IMPLANT
TOWEL GREEN STERILE FF (TOWEL DISPOSABLE) ×1 IMPLANT
WATER STERILE IRR 1000ML POUR (IV SOLUTION) ×1 IMPLANT

## 2023-09-15 NOTE — Anesthesia Procedure Notes (Addendum)
Procedure Name: Intubation Date/Time: 09/15/2023 7:48 AM  Performed by: Orlin Hilding, CRNAPre-anesthesia Checklist: Patient identified, Emergency Drugs available, Suction available, Patient being monitored and Timeout performed Patient Re-evaluated:Patient Re-evaluated prior to induction Oxygen Delivery Method: Circle system utilized Preoxygenation: Pre-oxygenation with 100% oxygen Induction Type: IV induction Ventilation: Mask ventilation without difficulty and Oral airway inserted - appropriate to patient size Laryngoscope Size: Mac and 4 Grade View: Grade I Tube type: Oral Tube size: 7.5 mm Number of attempts: 1 Placement Confirmation: ETT inserted through vocal cords under direct vision, positive ETCO2 and breath sounds checked- equal and bilateral Secured at: 23 cm Tube secured with: Tape Dental Injury: Teeth and Oropharynx as per pre-operative assessment

## 2023-09-15 NOTE — Transfer of Care (Signed)
Immediate Anesthesia Transfer of Care Note  Patient: Gabriel Henderson  Procedure(s) Performed: OPEN LUMBAR LAMINECTOMY, FORAMINOTOMY LUMBAR FOUR-FIVE, LUMBAR FIVE-SACRAL ONE  Patient Location: PACU  Anesthesia Type:General  Level of Consciousness: awake, drowsy, and patient cooperative  Airway & Oxygen Therapy: Patient Spontanous Breathing and Patient connected to face mask oxygen  Post-op Assessment: Report given to RN and Post -op Vital signs reviewed and stable  Post vital signs: Reviewed and stable  Last Vitals:  Vitals Value Taken Time  BP 134/72 09/15/23 1008  Temp    Pulse 72 09/15/23 1013  Resp 12 09/15/23 1013  SpO2 100 % 09/15/23 1013  Vitals shown include unfiled device data.  Last Pain:  Vitals:   09/15/23 0619  TempSrc:   PainSc: 0-No pain         Complications: No notable events documented.

## 2023-09-15 NOTE — Op Note (Signed)
Providing Compassionate, Quality Care - Together   Date of service: 09/15/2023   PREOP DIAGNOSIS:  L4-5, L5-S1 lumbar stenosis, foraminal stenosis, bilateral with neurogenic claudication and radiculopathy   POSTOP DIAGNOSIS: Same   PROCEDURE: Open bilateral L4, L5, S1 laminectomy, medial facetectomies at L4-5 and L5-S1, bilateral L5-S1 foraminotomies for decompression neural elements Intraoperative use of microscope for microdissection Intraoperative use of fluoroscopy   SURGEON: Dr. Kendell Bane C. Ortha Metts, DO   ASSISTANT: Dr. Barnett Abu, MD; Patrici Ranks, PA   ANESTHESIA: General Endotracheal   EBL: 25 cc   SPECIMENS: None   DRAINS: None   COMPLICATIONS: None   CONDITION: Hemodynamically stable   HISTORY: Gabriel Henderson is a 77 y.o. male with worsening bilateral lower extremity neurogenic claudication and radiculopathy.  MRI revealed severe multifactorial stenosis at L4-5 with moderate foraminal stenosis, moderate bilateral lateral recess stenosis at L5-S1 and severe bilateral foraminal stenosis.  He failed conservative measures and therefore I offered him surgical intervention in the form of a L4-S1 decompression, with foraminotomies.  We discussed all risks, benefits and expected outcomes as well as alternatives to treatment.  Informed consent was obtained and witnessed.  I answered all of his questions.   PROCEDURE IN DETAIL: The patient was brought to the operating room. After induction of general anesthesia, the patient was positioned on the operative table in the prone position. All pressure points were meticulously padded. Skin incision was then marked out and prepped and draped in the usual sterile fashion.   Using a 10 blade, midline incision was created over the L4, L5, S1 spinous processes.  Using Bovie electrocautery soft tissue dissection was performed down to the lumbodorsal fascia.  Subperiosteal dissection was performed bilaterally with Bovie electrocautery  exposing the L4, L5, S1 lamina bilaterally and the medial L4-5 and L5-S1 facets.  Deep retractors placed in the wound.  Lateral fluoroscopy confirmed the appropriate level.  The microscope was sterilely draped and brought into the field.  Using Leksell rongeur, the spinous process of L4, L5, S1 were removed down to the lamina.  Using a high-speed drill, the L4 lamina was removed bilaterally to the lateral recess down to the ligamentum flavum and superiorly up to the ligamentous attachment bilaterally.  Using high-speed drill, a partial bilateral facetectomy was performed down to the ligamentum flavum bilaterally.  The superior portion of the L5 lamina was then removed to the epidural space with the high speed drill. The ligamentum flavum was then gently dissected from the epidural space and resected with a series of Kerrison rongeurs to the lateral recess bilaterally.  Using a ball-tipped probe, bilateral lateral recesses appeared decompressed.  The thecal sac was pulsatile.  Using Kerrison rongeurs, the L5 nerve roots were followed and decompressed along the medial pedicle of L5 bilaterally.  Bilateral foramen at L4-5 were also palpated with a ball-tipped probe and noted to be adequately decompressed.    Using a high-speed drill, the L5 lamina was removed bilaterally to the lateral recess down to the ligamentum flavum and superiorly up to the ligamentous attachment bilaterally.  Using high-speed drill, a partial bilateral facetectomy was performed down to the ligamentum flavum.  The superior portion of the S1 lamina was then removed to the epidural space with the high speed drill. The ligamentum flavum was then gently dissected from the epidural space and resected with a series of Kerrison rongeurs to the lateral recess bilaterally.  Using a ball-tipped probe, bilateral lateral recesses appeared decompressed.  Using Kerrison rongeurs, the foramen was  decompressed bilaterally to decompress the L5 nerve roots.  I  could visualize L5 nerve roots in the foramen, they were palpated with ball-tipped probe and noted to be adequately decompressed.  The thecal sac was pulsatile.  Epidural hemostasis was achieved with Surgifoam..  A mixture of Depo-Medrol and fentanyl was placed in the epidural space. Deep retractor was taken out of the wound.  Hemostasis was achieved with bipolar cautery and the soft tissues.  The wound was closed in layers with 0 Vicryl sutures for muscle and fascia.  Dermis was closed with 2-0 and 3-0 Vicryl sutures.  Skin was closed with skin glue.  Sterile dressing was applied.   At the end of the case all sponge, needle, and instrument counts were correct. The patient was then transferred to the stretcher, extubated, and taken to the post-anesthesia care unit in stable hemodynamic condition.

## 2023-09-15 NOTE — H&P (Signed)
Providing Compassionate, Quality Care - Together  NEUROSURGERY HISTORY & PHYSICAL   Gabriel Henderson is an 77 y.o. male.   Chief Complaint: Neurogenic claudication and radiculopathy HPI: This is a 76 year old male with progressively worsening lower extremity numbness tingling and pain.  This has been altering his lifestyle causing him difficulty standing and walking for short distances.  MRI revealed multifactorial stenosis at L4-5 and L5-S1, he presents today for surgical decompression.  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 No pertinent family history. Social History:  reports that he has been smoking cigarettes. He does not have any smokeless tobacco history on file. He reports that he does not drink alcohol and does not use drugs.  Allergies: No Known Allergies  Medications Prior to Admission  Medication Sig Dispense Refill   amLODipine (NORVASC) 10 MG tablet Take 10 mg by mouth daily.     Cholecalciferol (VITAMIN D3) 50 MCG (2000 UT) TABS Take 2,000 Units by mouth in the morning.     cyanocobalamin (VITAMIN B12) 1000 MCG tablet Take 1,000 mcg by mouth daily.     diclofenac (VOLTAREN) 50 MG EC tablet Take 50 mg by mouth 2 (two) times daily.     folic acid (FOLVITE) 1 MG tablet Take 1 mg by mouth daily.     gabapentin (NEURONTIN) 300 MG capsule Take 600 mg by mouth 3 (three) times daily.     lisinopril (PRINIVIL,ZESTRIL) 20 MG tablet Take 20 mg by mouth in the morning.     Omega-3 Fatty Acids (FISH OIL) 1200 MG CAPS Take 1,200 mg by mouth daily.     omeprazole (PRILOSEC) 20 MG capsule Take 20 mg by mouth daily before breakfast.      predniSONE (DELTASONE) 20 MG tablet Take 60 mg by mouth daily with breakfast.     primidone (MYSOLINE) 50 MG tablet Take 50 mg by mouth 2 (two) times daily.     QUEtiapine (SEROQUEL) 200 MG tablet Take 200 mg by mouth at bedtime.     SUBOXONE 8-2 MG FILM Place 1 Film under the tongue 3 (three) times daily.      No results found for this or any previous visit (from the past 48 hour(s)). No results found.  ROS All pertinent positives and negatives are listed HPI above  Blood pressure 121/71, pulse 64, temperature 98.3 F (36.8 C), temperature source Oral, resp. rate 18, height 6\' 1"  (1.854 m), weight 65.3 kg, SpO2 95%. Physical Exam  Awake alert oriented x 3, no acute distress PERRLA Cranial nerves II through XII intact Bilateral upper/lower extremities full strength throughout Silt Deep tendon reflexes normal Nonlabored breathing   Assessment/Plan 77 year old male with  L4-5, L5-S1 stenosis, with neurogenic claudication and radiculopathy  -OR today for L4-5, L5-S1 laminectomy, foraminotomies.  We discussed all risks, benefits and expected outcomes as well as alternatives to treatment.  Informed consent was obtained and witnessed.  Extensively went over the surgical plan in detail with the patient.   Thank you for allowing me to participate in this patient's care.  Please do not  hesitate to call with questions or concerns.   Monia Pouch, DO Neurosurgeon [facility] [phone]

## 2023-09-15 NOTE — Anesthesia Postprocedure Evaluation (Signed)
Anesthesia Post Note  Patient: Gabriel Henderson  Procedure(s) Performed: OPEN LUMBAR LAMINECTOMY, FORAMINOTOMY LUMBAR FOUR-FIVE, LUMBAR FIVE-SACRAL ONE     Patient location during evaluation: PACU Anesthesia Type: General Level of consciousness: sedated and patient cooperative Pain management: pain level controlled Vital Signs Assessment: post-procedure vital signs reviewed and stable Respiratory status: spontaneous breathing Cardiovascular status: stable Anesthetic complications: no   No notable events documented.  Last Vitals:  Vitals:   09/15/23 1045 09/15/23 1115  BP: (!) 107/59 117/75  Pulse: (!) 55 68  Resp: 11 18  Temp:  36.5 C  SpO2: 96% 97%    Last Pain:  Vitals:   09/15/23 1150  TempSrc:   PainSc: 8                  [name] [employer]

## 2023-09-16 ENCOUNTER — Encounter (HOSPITAL_COMMUNITY): Payer: Self-pay | Admitting: Neurological Surgery

## 2023-09-16 DIAGNOSIS — M4807 Spinal stenosis, lumbosacral region: Secondary | ICD-10-CM | POA: Diagnosis not present

## 2023-09-16 MED ORDER — CYCLOBENZAPRINE HCL 10 MG PO TABS: 10.0000 mg | ORAL_TABLET | Freq: Three times a day (TID) | ORAL | 2 refills | Status: AC | PRN

## 2023-09-16 MED ORDER — OXYCODONE-ACETAMINOPHEN 10-325 MG PO TABS
1.0000 | ORAL_TABLET | ORAL | 0 refills | Status: DC | PRN
Start: 2023-09-16 — End: 2023-12-02

## 2023-09-16 NOTE — Progress Notes (Signed)
PT Cancellation Note and Discharge  Patient Details Name: Gabriel Henderson MRN: [id number] DOB: 11-23-1946   Cancelled Treatment:    Reason Eval/Treat Not Completed: PT screened, no needs identified, will sign off. Discussed pt case with OT who reports pt is currently mobilizing without assistance and does not require a formal PT evaluation at this time. PT signing off. If needs change, please reconsult.     Marylynn Pearson 09/16/2023, 9:53 AM  Conni Slipper, PT, DPT Acute Rehabilitation Services Secure Chat Preferred Office: [phone]

## 2023-09-16 NOTE — Progress Notes (Signed)
Patient alert and oriented, voiding adequately, skin clean, dry and intact without evidence of skin break down, or symptoms of complications - no redness or edema noted, only slight tenderness at site.  Patient states pain is manageable at time of discharge. Patient has an appointment with MD in 2 weeks 

## 2023-09-16 NOTE — Discharge Summary (Signed)
  Patient ID: Gabriel Henderson MRN: [id number] DOB/AGE: [birth year] 75 y.o.  Admit date: 09/15/2023 Discharge date: 09/16/2023  Admission Diagnoses: L4-5, L5-S1 lumbar stenosis, foraminal stenosis, bilateral with neurogenic claudication and radiculopathy  Discharge Diagnoses: L4-5, L5-S1 lumbar stenosis, foraminal stenosis, bilateral with neurogenic claudication and radiculopathy   Discharged Condition: Stable  Hospital Course:  Gabriel Henderson is a 77 y.o. male who was admitted following an uncomplicated L4-5, L5-S1 laminectomy. They were recovered in PACU and transferred to [facility]. Hospital course was uncomplicated. Pt stable for discharge today. Pt to f/u in office for routine post op visit. Pt is in agreement w/ plan.    Discharge Exam: Blood pressure 119/74, pulse 71, temperature 99 F (37.2 C), temperature source Oral, resp. rate 20, height 6\' 1"  (1.854 m), weight 65.3 kg, SpO2 97%. A&O x3 Speech fluent, appropriate Strength grossly intact BUE/BLE SILTx4.  Dressing c/d/I.   Disposition: Discharge disposition: 01-Home or Self Care       Discharge Instructions     Incentive spirometry RT   Complete by: As directed       Allergies as of 09/16/2023   No Known Allergies      Medication List     TAKE these medications    amLODipine 10 MG tablet Commonly known as: NORVASC Take 10 mg by mouth daily.   cyanocobalamin 1000 MCG tablet Commonly known as: VITAMIN B12 Take 1,000 mcg by mouth daily.   cyclobenzaprine 10 MG tablet Commonly known as: FLEXERIL Take 1 tablet (10 mg total) by mouth 3 (three) times daily as needed for muscle spasms.   diclofenac 50 MG EC tablet Commonly known as: VOLTAREN Take 1 tablet (50 mg total) by mouth 2 (two) times daily.   Fish Oil 1200 MG Caps Take 1,200 mg by mouth daily.   folic acid 1 MG tablet Commonly known as: FOLVITE Take 1 mg by mouth daily.   gabapentin 300 MG capsule Commonly known as: NEURONTIN Take 600 mg by  mouth 3 (three) times daily.   lisinopril 20 MG tablet Commonly known as: ZESTRIL Take 20 mg by mouth in the morning.   omeprazole 20 MG capsule Commonly known as: PRILOSEC Take 20 mg by mouth daily before breakfast.   oxyCODONE-acetaminophen 10-325 MG tablet Commonly known as: Percocet Take 1 tablet by mouth every 4 (four) hours as needed for pain.   predniSONE 20 MG tablet Commonly known as: DELTASONE Take 60 mg by mouth daily with breakfast.   primidone 50 MG tablet Commonly known as: MYSOLINE Take 50 mg by mouth 2 (two) times daily.   QUEtiapine 200 MG tablet Commonly known as: SEROQUEL Take 200 mg by mouth at bedtime.   Suboxone 8-2 MG Film Generic drug: Buprenorphine HCl-Naloxone HCl Place 1 Film under the tongue 3 (three) times daily.   Vitamin D3 50 MCG (2000 UT) Tabs Take 2,000 Units by mouth in the morning.        Follow-up Information     Dawley, Troy C, DO. Call.   Why: As needed, If symptoms worsen Contact information: [address] [place] [address] [phone]                 Signed: Clovis Riley 09/16/2023, 10:16 AM

## 2023-09-16 NOTE — Evaluation (Signed)
Occupational Therapy Evaluation Patient Details Name: Gabriel Henderson MRN: [id number] DOB: [birth year] Today's Date: 09/16/2023   History of Present Illness 77 year old male with progressively worsening lower extremity numbness tingling and pain.  This has been altering his lifestyle causing him difficulty standing and walking for short distances.  MRI revealed multifactorial stenosis at L4-5 and L5-S1, he is now s/p lumbar laminectomy L4-L5 and L5-S1. PMH of COPD, HTN, RA   Clinical Impression   Pt admitted for above, educated pt on POB precautions and he recalls 3/3 without cueing. Pt ambulating with supervision no AD, continues to have mild balance deficits and his BLEs remain in a slight state of flexion during gait, BLE numbness noted. Pt demonstrating good maintain of precautions with ADLs, struggles with LBD and LBB but introduced AE to help pt remain functional independence and gave handout on AE. Pt has no further acute skilled OT services at this time. NO follow up OT needed.        If plan is discharge home, recommend the following: Assistance with cooking/housework;Assist for transportation    Functional Status Assessment  Patient has had a recent decline in their functional status and demonstrates the ability to make significant improvements in function in a reasonable and predictable amount of time.  Equipment Recommendations  None recommended by OT    Recommendations for Other Services       Precautions / Restrictions Precautions Precautions: Back Restrictions Weight Bearing Restrictions: No      Mobility Bed Mobility               General bed mobility comments: pt OOB on arrival, left in recliner    Transfers Overall transfer level: Needs assistance Equipment used: None Transfers: Sit to/from Stand Sit to Stand: Supervision           General transfer comment: STS x2, pt demonstrated car transfer perfectly during session simulation with low chair       Balance Overall balance assessment: Needs assistance Sitting-balance support: Feet supported Sitting balance-Leahy Scale: Good Sitting balance - Comments: in recliner   Standing balance support: During functional activity, No upper extremity supported Standing balance-Leahy Scale: Fair                             ADL either performed or assessed with clinical judgement   ADL Overall ADL's : Needs assistance/impaired Eating/Feeding: Independent;Sitting   Grooming: Supervision/safety;Standing Grooming Details (indicate cue type and reason): discussed with compensatory strategies at sink to maintain POB precautions during hygiene routine Upper Body Bathing: Independent;Sitting   Lower Body Bathing: Sitting/lateral leans;Minimal assistance Lower Body Bathing Details (indicate cue type and reason): Discussed with pt the use of AE (long handle sponge) to complete bathing Upper Body Dressing : Sitting;Modified independent   Lower Body Dressing: Contact guard assist;Sitting/lateral leans Lower Body Dressing Details (indicate cue type and reason): Significantly increased challenge for him to don socks due to lack of flexibility and inability to use compensatory strategies which caused too much discomfort. Demonstrated use of sockaid to pt, he loved the idea. Also introduce the towel method. Toilet Transfer: Supervision/safety;Ambulation   Toileting- Clothing Manipulation and Hygiene: Supervision/safety;Sit to/from stand       Functional mobility during ADLs: Supervision/safety General ADL Comments: AE education for ADLs, discussed pt follow-up with MD once cleared to do outpaitent PT to progress balance     Vision         Perception  Praxis         Pertinent Vitals/Pain Pain Assessment Pain Assessment: Faces Faces Pain Scale: Hurts whole lot Pain Location: back Pain Descriptors / Indicators: Aching, Discomfort, Sore, Grimacing, Guarding Pain  Intervention(s): Limited activity within patient's tolerance, Monitored during session, Premedicated before session, Repositioned (RN gave medications prior to OT session, pt requesting more but explained to pt that his medications are on a time limit. PA present during conversation and discussed with pt an alt pain medication if needed. He wanted to thing about it.)     Extremity/Trunk Assessment Upper Extremity Assessment Upper Extremity Assessment: Overall WFL for tasks assessed   Lower Extremity Assessment Lower Extremity Assessment: Generalized weakness (bilat numbness in lower bilat 1/3 of calves down to toes)   Cervical / Trunk Assessment Cervical / Trunk Assessment: Kyphotic (slightly kyphotic)   Communication Communication Communication: No apparent difficulties Cueing Techniques: Verbal cues   Cognition Arousal: Alert Behavior During Therapy: WFL for tasks assessed/performed Overall Cognitive Status: Within Functional Limits for tasks assessed                                       General Comments  VSS on RA    Exercises     Shoulder Instructions      Home Living Family/patient expects to be discharged to:: Private residence Living Arrangements: Spouse/significant other Available Help at Discharge: Family;Available 24 hours/day Type of Home: House Home Access: Level entry     Home Layout: Two level;Able to live on main level with bedroom/bathroom     Bathroom Shower/Tub: Chief Strategy Officer: Standard     Home Equipment: Agricultural consultant (2 wheels);Cane - quad;Grab bars - tub/shower;BSC/3in1          Prior Functioning/Environment Prior Level of Function : Independent/Modified Independent;Driving             Mobility Comments: Ind bit with poor posture and bilat LEs strongly flexed ADLs Comments: Mod I        OT Problem List: Decreased strength;Pain;Impaired balance (sitting and/or standing)      OT  Treatment/Interventions:      OT Goals(Current goals can be found in the care plan section) Acute Rehab OT Goals Patient Stated Goal: To go home OT Goal Formulation: With patient Time For Goal Achievement: 09/30/23 Potential to Achieve Goals: Good  OT Frequency:      Co-evaluation              AM-PAC OT "6 Clicks" Daily Activity     Outcome Measure Help from another person eating meals?: None Help from another person taking care of personal grooming?: A Little Help from another person toileting, which includes using toliet, bedpan, or urinal?: A Little Help from another person bathing (including washing, rinsing, drying)?: A Little Help from another person to put on and taking off regular upper body clothing?: None Help from another person to put on and taking off regular lower body clothing?: A Little 6 Click Score: 20   End of Session Nurse Communication: Mobility status  Activity Tolerance: Patient tolerated treatment well Patient left: with call bell/phone within reach;in chair  OT Visit Diagnosis: Unsteadiness on feet (R26.81);Pain;Muscle weakness (generalized) (M62.81) Pain - part of body:  (back)                Time: 6295-2841 OT Time Calculation (min): 31 min Charges:  OT General Charges $OT  Visit: 1 Visit OT Evaluation $OT Eval Moderate Complexity: 1 Mod OT Treatments $Therapeutic Activity: 8-22 mins  09/16/2023  AB, OTR/L  Acute Rehabilitation Services  Office: [phone]   Tristan Schroeder 09/16/2023, 9:33 AM

## 2023-09-16 NOTE — Discharge Instructions (Signed)
Wound Care Remove dressing in 3 days Leave incision open to air. You may shower. Do not scrub directly on incision.  Do not put any creams, lotions, or ointments on incision. Activity Walk each and every day, increasing distance each day. No lifting greater than 8 lbs.  Avoid bending, arching, and twisting. No driving for 2 weeks; may ride as a passenger locally.  Diet Resume your normal diet.   Call Your Doctor If Any of These Occur Redness, drainage, or swelling at the wound.  Temperature greater than 101 degrees. Severe pain not relieved by pain medication. Incision starts to come apart. Follow Up Appt Call [phone]) for problems.  If you have any hardware placed in your spine, you will need an x-ray before your appointment.

## 2023-09-19 ENCOUNTER — Encounter (HOSPITAL_COMMUNITY): Payer: Self-pay | Admitting: Neurological Surgery

## 2023-09-19 NOTE — OR Nursing (Signed)
Depomedrol 40 mg given to operative site

## 2023-12-02 ENCOUNTER — Ambulatory Visit: Payer: No Typology Code available for payment source | Admitting: Diagnostic Neuroimaging

## 2023-12-02 ENCOUNTER — Encounter: Payer: Self-pay | Admitting: Diagnostic Neuroimaging

## 2023-12-02 VITALS — BP 151/73 | HR 103 | Ht 73.0 in | Wt 158.4 lb

## 2023-12-02 DIAGNOSIS — G629 Polyneuropathy, unspecified: Secondary | ICD-10-CM

## 2023-12-02 DIAGNOSIS — G250 Essential tremor: Secondary | ICD-10-CM | POA: Diagnosis not present

## 2023-12-02 DIAGNOSIS — R200 Anesthesia of skin: Secondary | ICD-10-CM

## 2023-12-02 DIAGNOSIS — R269 Unspecified abnormalities of gait and mobility: Secondary | ICD-10-CM

## 2023-12-02 DIAGNOSIS — R202 Paresthesia of skin: Secondary | ICD-10-CM | POA: Diagnosis not present

## 2023-12-02 NOTE — Patient Instructions (Addendum)
NUMBNESS IN FEET (mild numbness noted post-operatively in Sept 2024 after lumbar decompression for spinal stenosis, now worsening since Nov 2024; could be underlying neuropathy, B12 defiency or other cause. Also could be sequelae from lumbar spinal stenosis; previous high pain levels may have masked underlying numbness; also consider worsening scar tissue or other worsening of lumbar spine pathology) - check neuropathy labs - check EMG/NCS (last done by Dr. 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 Bertram Savin (neurologist in [facility] 2024); unclear diagnosis; recommend patient to follow up with that neurologist to get details of diagnosis, duration of prednisone treatment etc. - continue pregabalin, gabapentin  ESSENTIAL TREMOR  - continue primidone; follow up with [facility] neurology / neurosurgery

## 2023-12-02 NOTE — Progress Notes (Signed)
[facility]  PATIENT: Gabriel Henderson DOB: 02/01/1946  REFERRING CLINICIAN: Clovis Riley,* HISTORY FROM: patient and chart review REASON FOR VISIT: new consult   HISTORICAL  CHIEF COMPLAINT:  Chief Complaint  Patient presents with   New Patient (Initial Visit)    Rm 7, alone.  Bil. Numbness below calves.  Progressively worsening numbness.  Did have [place]/EMG 04/2022.     HISTORY OF PRESENT ILLNESS:   77 year old male here for evaluation of lower extremity numbness.  Around 2022 patient had onset of severe low back pain radiating to the legs.  He developed a stooped over posture, intense lower back pain, intense leg pain.  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  This was managed conservatively with epidural steroid injections and therapy.  Symptoms did not improve.  September 2024 he underwent surgery for lumbar decompression.  Following surgery his low back pain and leg pain significantly improved.  He did notice some mild numbness in his feet postoperatively.  His gait had improved.  However over time the numbness in his feet has increased.  His gait has deteriorated.  He saw neurosurgery clinic in follow-up and was told that his numbness in feet is not likely related to his low back or postoperative issue.  They referred patient here for EMG / NCS testing.  Of note patient was in the hospital in May 2023 for fever and ataxia.  He was diagnosed with B12 deficiency and recommended to start oral replacement.  Patient has seen 2 other neurologists as well.  He went to [facility] neurology in May 2023 and saw Dr. Heide Spark, who apparently did EMG nerve conduction study and was told it was "normal".  He was recommended to continue B12 replacement for neuropathy symptoms.  Earlier in 2024 patient saw a different neurologist in [place] (Dr. Bertram Savin), and was started on prednisone 60 mg daily for "neuropathy".  No specific cause of  neuropathy noted per patient.  No duration of therapy is known per patient.  Patient also has been having issues with essential tremor for several years.  He saw a neurosurgeon at [facility] for consideration of surgical options.   REVIEW OF SYSTEMS: Full 14 system review of systems performed and negative with exception of: as per HPI.  ALLERGIES: No Known Allergies  HOME MEDICATIONS: Outpatient Medications Prior to Visit  Medication Sig Dispense Refill   amLODipine (NORVASC) 10 MG tablet Take 10 mg by mouth daily.     Cholecalciferol (VITAMIN D3) 50 MCG (2000 UT) TABS Take 2,000 Units by mouth in the morning.     cyanocobalamin (VITAMIN B12) 1000 MCG tablet Take 1,000 mcg by mouth daily.     cyclobenzaprine (FLEXERIL) 10 MG tablet Take 1 tablet (10 mg total) by mouth 3 (three) times daily as needed for muscle spasms. 90 tablet 2   diclofenac (VOLTAREN) 50 MG EC tablet Take 1 tablet (50 mg total) by mouth 2 (two) times daily.     folic acid (FOLVITE) 1 MG tablet Take 1 mg by mouth daily.     gabapentin (NEURONTIN) 300 MG capsule Take 600 mg by mouth 3 (three) times daily.     lisinopril (PRINIVIL,ZESTRIL) 20 MG tablet Take 20 mg by mouth in the morning.     Omega-3 Fatty Acids (FISH OIL) 1200 MG CAPS Take 1,200 mg by mouth daily.     omeprazole (PRILOSEC) 20 MG capsule Take 20 mg by mouth daily before breakfast.  predniSONE (DELTASONE) 20 MG tablet Take 60 mg by mouth daily with breakfast.     pregabalin (LYRICA) 50 MG capsule Take 50 mg by mouth daily.     primidone (MYSOLINE) 50 MG tablet Take 50 mg by mouth 2 (two) times daily.     QUEtiapine (SEROQUEL) 200 MG tablet Take 200 mg by mouth at bedtime.     SUBOXONE 8-2 MG FILM Place 1 Film under the tongue 3 (three) times daily.     oxyCODONE-acetaminophen (PERCOCET) 10-325 MG tablet Take 1 tablet by mouth every 4 (four) hours as needed for pain. 30 tablet 0   No facility-administered medications prior to visit.    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, mean grad 13 mmHg 01/15/21 ([place])   Hypertension     PAST SURGICAL HISTORY: Past Surgical History:  Procedure Laterality Date   HIP SURGERY     x 2   LUMBAR LAMINECTOMY/DECOMPRESSION MICRODISCECTOMY N/A 09/15/2023   Procedure: OPEN LUMBAR LAMINECTOMY, FORAMINOTOMY LUMBAR FOUR-FIVE, LUMBAR FIVE-SACRAL ONE;  Surgeon: Bethann Goo, DO;  Location: [facility] OR;  Service: Neurosurgery;  Laterality: N/A;  3C    FAMILY HISTORY: Family History  Problem Relation Age of Onset   Stroke Mother    Heart murmur Mother    Heart murmur Father    Heart murmur Sister     SOCIAL HISTORY: Social History   Socioeconomic History   Marital status: Married    Spouse name: Not on file   Number of children: Not on file   Years of education: Not on file   Highest education level: Not on file  Occupational History   Not on file  Tobacco Use   Smoking status: Every Day    Current packs/day: 0.50    Types: Cigarettes   Smokeless tobacco: Never  Vaping Use   Vaping status: Former  Substance and Sexual Activity   Alcohol use: Not Currently   Drug use: Not Currently    Types: Cocaine, Heroin, Marijuana   Sexual activity: Not on file  Other Topics Concern   Not on file  Social History Narrative   Caffiene decaff 3 cups am   Retired :  Engineer, maintenance , Medical laboratory scientific officer [place]   Married    Social Drivers of Corporate investment banker Strain: Not on file  Food Insecurity: Not on file  Transportation Needs: Not on file  Physical Activity: Not on file  Stress: Not on file  Social Connections: Not on file  Intimate Partner Violence: Not on file     PHYSICAL EXAM  GENERAL EXAM/CONSTITUTIONAL: Vitals:  Vitals:   12/02/23 0902  BP: (!) 151/73   Pulse: (!) 103  Weight: 158 lb 6.4 oz (71.8 kg)  Height: 6\' 1"  (1.854 m)   Body mass index is 20.9 kg/m. Wt Readings from Last 3 Encounters:  12/02/23 158 lb 6.4 oz (71.8 kg)  09/15/23 144 lb (65.3 kg)  09/12/23 144 lb 1.6 oz (65.4 kg)   Patient is in no distress; well developed, nourished and groomed; neck is supple  CARDIOVASCULAR: Examination of carotid arteries is normal; no carotid bruits Regular rate and rhythm, no murmurs Examination of peripheral vascular system by observation and palpation is normal  EYES:  Ophthalmoscopic exam of optic discs and posterior segments is normal; no papilledema or hemorrhages No results found.  MUSCULOSKELETAL: Gait, strength, tone, movements noted in Neurologic exam below  NEUROLOGIC: MENTAL STATUS:      No data to display         awake, alert, oriented to person, place and time recent and remote memory intact normal attention and concentration language fluent, comprehension intact, naming intact fund of knowledge appropriate  CRANIAL NERVE:  2nd - no papilledema on fundoscopic exam 2nd, 3rd, 4th, 6th - pupils equal and reactive to light, visual fields full to confrontation, extraocular muscles intact, no nystagmus 5th - facial sensation symmetric 7th - facial strength symmetric 8th - hearing intact 9th - palate elevates symmetrically, uvula midline 11th - shoulder shrug symmetric 12th - tongue protrusion midline  MOTOR:  normal bulk and tone, full strength in the BUE, BLE; EXCEPT BLE 4+ PROX, 4 DISTAL MILD POSTURAL TREMOR IN BUE MODERATE TREMOR WITH HANDWRITING  SENSORY:  normal and symmetric to light touch, temperature, vibration; DECR IN FEET / ANKLE  COORDINATION:  finger-nose-finger, fine finger movements normal  REFLEXES:  deep tendon reflexes TRACE and symmetric  GAIT/STATION:  WIDE based gait; MILD STEPPAGE GAIT; UNSTEADY     DIAGNOSTIC DATA (LABS, IMAGING, TESTING) - I reviewed patient records,  labs, notes, testing and imaging myself where available.  Lab Results  Component Value Date   WBC 10.8 (H) 09/12/2023   HGB 12.0 (L) 09/12/2023   HCT 37.3 (L) 09/12/2023   MCV 104.5 (H) 09/12/2023   PLT 201 09/12/2023      Component Value Date/Time   NA 137 09/12/2023 1400   K 5.2 (H) 09/12/2023 1400   CL 105 09/12/2023 1400   CO2 24 09/12/2023 1400   GLUCOSE 128 (H) 09/12/2023 1400   BUN 23 09/12/2023 1400   CREATININE 1.21 09/12/2023 1400   CALCIUM 8.8 (L) 09/12/2023 1400   PROT 6.7 04/25/2022 1138   ALBUMIN 3.2 (L) 04/25/2022 1138   AST 23 04/25/2022 1138   ALT 19 04/25/2022 1138   ALKPHOS 64 04/25/2022 1138   BILITOT 0.6 04/25/2022 1138   GFRNONAA >60 09/12/2023 1400   GFRAA >60 01/24/2017 0717   Lab Results  Component Value Date   CHOL 117 04/23/2022   HDL 44 04/23/2022   LDLCALC 63 04/23/2022   TRIG 52 04/23/2022   CHOLHDL 2.7 04/23/2022   Lab Results  Component Value Date   HGBA1C 5.3 04/23/2022   Lab Results  Component Value Date   VITAMINB12 150 (L) 04/23/2022   Lab Results  Component Value Date   TSH 0.464 01/22/2017    03/10/22 MRI lumbar spine [I reviewed images myself and agree with interpretation. -VRP]  1. At L4-5 there is a broad-based disc bulge flattening the ventral thecal sac. Moderate bilateral facet arthropathy with ligamentum flavum infolding. Severe spinal stenosis. Mild bilateral foraminal stenosis.  2. At L5-S1 there is a broad-based disc bulge. Moderate bilateral facet arthropathy. Severe bilateral foraminal stenosis. Bilateral subarticular recess stenosis.  3. Otherwise, diffuse lumbar spine spondylosis as described above.  4. No acute osseous injury of the lumbar spine.  04/25/22 MRI brain  - Mild age related volume loss.  No focal or acute finding     ASSESSMENT AND PLAN  77 y.o. year old male here with:   Dx:  1. Numbness and tingling of both feet   2. Neuropathy   3. Essential tremor   4. Gait difficulty  PLAN:  NUMBNESS IN FEET (mild numbness noted post-operatively in Sept 2024 after lumbar decompression for spinal stenosis, now worsening since Nov 2024; could be underlying neuropathy, B12 defiency or other cause. Also could be sequelae from lumbar spinal stenosis; previous high pain levels may have masked underlying numbness; also consider worsening scar tissue or other worsening of lumbar spine pathology may have developed) - check neuropathy labs - check EMG/NCS (last done by Dr. Sherlynn Stalls Neurology in May 2023 and apparently normal; will request to repeat testing be done in [facility]) - follow up with neurosurgery clinic to get repeat MRI lumbar spine (due to worsening lower ext symptoms in last month) - patient was started on prednisone for neuropathy by Dr. Bertram Savin (neurologist in [place] 2024); unclear diagnosis; recommend patient to follow up with that neurologist to get details of diagnosis, and recommendation of duration of prednisone treatment etc. - continue pregabalin, gabapentin  ESSENTIAL TREMOR  - continue primidone; follow up with [facility] neurology / neurosurgery  Orders Placed This Encounter  Procedures   Vitamin B12   CBC with diff   CMP   MMA   Homocysteine   A1c   TSH   SPEP with IFE   ANA w/Reflex   SSA, SSB   ESR   CRP   Vitamin B1   Vitamin B6   Hepatitis C antibody   Hepatitis B core antibody, total   Hepatitis B surface antigen   Hepatitis B surface antibody, qualitative   RPR   HIV   Copper   ANCA Profile   Intrinsic Factor Antibodies   Anti-parietal antibody   NCV with EMG(electromyography)   Return for pending test results, pending if symptoms worsen or fail to improve.  I spent 60 minutes of face-to-face and non-face-to-face time with patient.  This included previsit chart review, lab review, study review, order entry, electronic health record documentation, patient education.     Suanne Marker, MD 12/02/2023,  10:12 AM Certified in Neurology, Neurophysiology and Neuroimaging  [facility] [address], [address] [place], [place] [address] [phone]

## 2023-12-05 ENCOUNTER — Telehealth: Payer: Self-pay | Admitting: Diagnostic Neuroimaging

## 2023-12-05 NOTE — Telephone Encounter (Signed)
Pt said, lab work result showing on MyChart some abnormalities. Would like a call from the nurse to discuss.  Informed the patient once Dr. Marjory Lies received the results the nurse will call with those results and any steps going forward.. Patient verbalized understand.

## 2023-12-05 NOTE — Telephone Encounter (Signed)
Called pt and informed. Pt was very appreciative.

## 2023-12-05 NOTE — Telephone Encounter (Signed)
Pt asking if it would be ok for him to keep exercising with his condition. Requesting call back

## 2023-12-07 ENCOUNTER — Telehealth: Payer: Self-pay | Admitting: Diagnostic Neuroimaging

## 2023-12-07 LAB — ANA W/REFLEX: ANA Titer 1: NEGATIVE

## 2023-12-07 NOTE — Telephone Encounter (Signed)
Pt asked that it be forwarded on to Dr Marjory Lies that he did not have numbness associated with pain , he would like to discuss this further with RN

## 2023-12-07 NOTE — Telephone Encounter (Signed)
Please see previous phone message and notes sent to the patient. I have attached this information to previous phone message and sent to Dr Marjory Lies to review. Pt still has lab work that is pending as well.

## 2023-12-12 LAB — VITAMIN B12: Vitamin B-12: 1256 pg/mL — ABNORMAL HIGH (ref 232–1245)

## 2023-12-12 LAB — CBC WITH DIFFERENTIAL/PLATELET
Basophils Absolute: 0 10*3/uL (ref 0.0–0.2)
Basos: 0 %
EOS (ABSOLUTE): 0 10*3/uL (ref 0.0–0.4)
Eos: 0 %
Hematocrit: 39.9 % (ref 37.5–51.0)
Hemoglobin: 13.4 g/dL (ref 13.0–17.7)
Immature Grans (Abs): 0.2 10*3/uL — ABNORMAL HIGH (ref 0.0–0.1)
Immature Granulocytes: 2 %
Lymphocytes Absolute: 2.5 10*3/uL (ref 0.7–3.1)
Lymphs: 23 %
MCH: 33.3 pg — ABNORMAL HIGH (ref 26.6–33.0)
MCHC: 33.6 g/dL (ref 31.5–35.7)
MCV: 99 fL — ABNORMAL HIGH (ref 79–97)
Monocytes Absolute: 0.6 10*3/uL (ref 0.1–0.9)
Monocytes: 6 %
Neutrophils Absolute: 7.4 10*3/uL — ABNORMAL HIGH (ref 1.4–7.0)
Neutrophils: 69 %
Platelets: 176 10*3/uL (ref 150–450)
RBC: 4.03 x10E6/uL — ABNORMAL LOW (ref 4.14–5.80)
RDW: 13.7 % (ref 11.6–15.4)
WBC: 10.8 10*3/uL (ref 3.4–10.8)

## 2023-12-12 LAB — HEPATITIS C ANTIBODY: Hep C Virus Ab: REACTIVE — AB

## 2023-12-12 LAB — HOMOCYSTEINE: Homocysteine: 15.5 umol/L (ref 0.0–19.2)

## 2023-12-12 LAB — MULTIPLE MYELOMA PANEL, SERUM
Albumin SerPl Elph-Mcnc: 4.2 g/dL (ref 2.9–4.4)
Albumin/Glob SerPl: 1.7 (ref 0.7–1.7)
Alpha 1: 0.2 g/dL (ref 0.0–0.4)
Alpha2 Glob SerPl Elph-Mcnc: 0.8 g/dL (ref 0.4–1.0)
B-Globulin SerPl Elph-Mcnc: 0.9 g/dL (ref 0.7–1.3)
Gamma Glob SerPl Elph-Mcnc: 0.7 g/dL (ref 0.4–1.8)
Globulin, Total: 2.6 g/dL (ref 2.2–3.9)
IgA/Immunoglobulin A, Serum: 72 mg/dL (ref 61–437)
IgG (Immunoglobin G), Serum: 679 mg/dL (ref 603–1613)
IgM (Immunoglobulin M), Srm: 155 mg/dL — ABNORMAL HIGH (ref 15–143)

## 2023-12-12 LAB — COMPREHENSIVE METABOLIC PANEL
ALT: 35 [IU]/L (ref 0–44)
AST: 29 [IU]/L (ref 0–40)
Albumin: 4.6 g/dL (ref 3.8–4.8)
Alkaline Phosphatase: 82 [IU]/L (ref 44–121)
BUN/Creatinine Ratio: 19 (ref 10–24)
BUN: 24 mg/dL (ref 8–27)
Bilirubin Total: 0.2 mg/dL (ref 0.0–1.2)
CO2: 23 mmol/L (ref 20–29)
Calcium: 9.3 mg/dL (ref 8.6–10.2)
Chloride: 104 mmol/L (ref 96–106)
Creatinine, Ser: 1.26 mg/dL (ref 0.76–1.27)
Globulin, Total: 2.2 g/dL (ref 1.5–4.5)
Glucose: 74 mg/dL (ref 70–99)
Potassium: 4.1 mmol/L (ref 3.5–5.2)
Sodium: 143 mmol/L (ref 134–144)
Total Protein: 6.8 g/dL (ref 6.0–8.5)
eGFR: 59 mL/min/{1.73_m2} — ABNORMAL LOW (ref >59)

## 2023-12-12 LAB — C-REACTIVE PROTEIN: CRP: <1 mg/L (ref 0–10)

## 2023-12-12 LAB — HEMOGLOBIN A1C
Est. average glucose Bld gHb Est-mCnc: 114 mg/dL
Hgb A1c MFr Bld: 5.6 % (ref 4.8–5.6)

## 2023-12-12 LAB — ANCA PROFILE
Anti-MPO Antibodies: <0.2 U (ref 0.0–0.9)
Anti-PR3 Antibodies: <0.2 U (ref 0.0–0.9)
Atypical pANCA: <1:20 {titer}
C-ANCA: <1:20 {titer}
P-ANCA: <1:20 {titer}

## 2023-12-12 LAB — TSH: TSH: 0.843 u[IU]/mL (ref 0.450–4.500)

## 2023-12-12 LAB — HIV ANTIBODY (ROUTINE TESTING W REFLEX): HIV Screen 4th Generation wRfx: NONREACTIVE

## 2023-12-12 LAB — HEPATITIS B CORE ANTIBODY, TOTAL: Hep B Core Total Ab: POSITIVE — AB

## 2023-12-12 LAB — SJOGREN'S SYNDROME ANTIBODS(SSA + SSB)
ENA SSA (RO) Ab: <0.2 AI (ref 0.0–0.9)
ENA SSB (LA) Ab: 2.4 AI — ABNORMAL HIGH (ref 0.0–0.9)

## 2023-12-12 LAB — INTRINSIC FACTOR ANTIBODIES: Intrinsic Factor Abs, Serum: 1.2 [AU]/ml — ABNORMAL HIGH (ref 0.0–1.1)

## 2023-12-12 LAB — HEPATITIS B SURFACE ANTIGEN: Hepatitis B Surface Ag: NEGATIVE

## 2023-12-12 LAB — RPR: RPR Ser Ql: NONREACTIVE

## 2023-12-12 LAB — ANTI-PARIETAL ANTIBODY: Parietal Cell Ab: 1.4 U (ref 0.0–20.0)

## 2023-12-12 LAB — COPPER, SERUM: Copper: 82 ug/dL (ref 69–132)

## 2023-12-12 LAB — VITAMIN B1: Thiamine: 129.3 nmol/L (ref 66.5–200.0)

## 2023-12-12 LAB — VITAMIN B6: Vitamin B6: 8.5 ug/L (ref 3.4–65.2)

## 2023-12-12 LAB — HEPATITIS B SURFACE ANTIBODY,QUALITATIVE: Hep B Surface Ab, Qual: REACTIVE

## 2023-12-12 LAB — SEDIMENTATION RATE: Sed Rate: 2 mm/h (ref 0–30)

## 2023-12-12 LAB — METHYLMALONIC ACID, SERUM: Methylmalonic Acid: 105 nmol/L (ref 0–378)

## 2023-12-12 NOTE — Telephone Encounter (Signed)
Called the patient and reviewed the lab results with him. I have routed the labs to [facility] with [facility] as well as his Rheumatologist in [place]. Pt verbalized understanding. Pt had no questions at this time but was encouraged to call back if questions arise.    "Hepatitis B and C labs positive (prior or resolved infx; needs PCP and GI follow up). SSB slightly positive (rheum follow up). IF antibody positive (needs to continue long term B12 replacement, but current B12 is better). Other labs ok. Please call patient. -VRP "

## 2023-12-29 ENCOUNTER — Ambulatory Visit (INDEPENDENT_AMBULATORY_CARE_PROVIDER_SITE_OTHER): Payer: Medicare Other | Admitting: Diagnostic Neuroimaging

## 2023-12-29 ENCOUNTER — Ambulatory Visit (INDEPENDENT_AMBULATORY_CARE_PROVIDER_SITE_OTHER): Payer: Self-pay | Admitting: Diagnostic Neuroimaging

## 2023-12-29 DIAGNOSIS — R202 Paresthesia of skin: Secondary | ICD-10-CM | POA: Diagnosis not present

## 2023-12-29 DIAGNOSIS — G629 Polyneuropathy, unspecified: Secondary | ICD-10-CM

## 2023-12-29 DIAGNOSIS — R200 Anesthesia of skin: Secondary | ICD-10-CM

## 2023-12-29 DIAGNOSIS — Z0289 Encounter for other administrative examinations: Secondary | ICD-10-CM

## 2023-12-30 ENCOUNTER — Telehealth: Payer: Self-pay | Admitting: Diagnostic Neuroimaging

## 2023-12-30 NOTE — Telephone Encounter (Signed)
 Call to patient who reports more numbing in right leg since nerve conduction study yesterday. Advised him per a conversation with Dr. Onita that it was not correlated to the study and if not improving the to call back Monday. Patein appreciative of call and in agreement

## 2023-12-30 NOTE — Telephone Encounter (Signed)
 Pt states he had a nerve conduction yesterday and since his right leg has been more numb than before. Asking if this is a normal side effect or not. Requesting call back

## 2024-01-02 NOTE — Procedures (Signed)
 [facility]  NCS (NERVE CONDUCTION STUDY) WITH EMG (ELECTROMYOGRAPHY) REPORT   STUDY DATE: 12/29/23 PATIENT NAME: Gabriel Henderson DOB: June 19, 1946 MRN: [id number]  ORDERING CLINICIAN: Eduard Hanlon, MD   TECHNOLOGIST: CHRISTELLA Ahle ELECTROMYOGRAPHER: Eduard SAUNDERS. Shakoya Gilmore, MD  CLINICAL INFORMATION: 78 year old male here for evaluation of lower extremity numbness.  History of severe lumbar spinal stenosis status post surgery in 2024.  FINDINGS: NERVE CONDUCTION STUDY:  Bilateral tibial motor responses have decreased amplitudes and slowed conduction velocities.  Left peroneal motor response has decreased amplitude and slow conduction velocity.  Right peroneal motor response could not be obtained.  Right tibial F-wave latency could not be obtained.  Left tibial F wave latency is prolonged at 76.8 ms.  Bilateral sural and superficial peroneal sensory responses could not be obtained.   NEEDLE ELECTROMYOGRAPHY:  Needle examination of right lower extremity shows abnormal spontaneous activity in right tibialis anterior and right gastrocnemius muscles at rest and decreased recruitment of large motor units on exertion.  Right vastus medialis is normal.    IMPRESSION:   Abnormal study demonstrating: -Severe axonal sensorimotor polyneuropathy.  Significantly prolonged left tibial F-wave latency raise possibility of underlying demyelination. 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   INTERPRETING PHYSICIAN:  EDUARD FABIENE HANLON, MD Certified in Neurology, Neurophysiology and Neuroimaging  [facility] [address], [address] [place], [place] [address] [phone]  [facility]    Nerve / Sites Muscle Latency Ref. Amplitude Ref. Rel Amp Segments Distance Velocity Ref. Area    ms ms mV mV %  cm m/s m/s mVms  R Peroneal - EDB     Ankle EDB NR <=6.5 NR >=2.0 NR Ankle - EDB 9   NR         Pop fossa - Ankle      L Peroneal - EDB     Ankle  EDB 5.7 <=6.5 1.8 >=2.0 100 Ankle - EDB 9   6.5     Fib head EDB 14.9  1.8  103 Fib head - Ankle 31 34 >=44 7.4     Pop fossa EDB 17.0  1.8  99.4 Pop fossa - Fib head 8 37 >=44 7.2         Pop fossa - Ankle      R Tibial - AH     Ankle AH 5.4 <=5.8 0.9 >=4.0 100 Ankle - AH 9   2.1     Pop fossa AH 16.5  0.9  91.6 Pop fossa - Ankle 41 37 >=41 1.2  L Tibial - AH     Ankle AH 5.6 <=5.8 2.8 >=4.0 100 Ankle - AH 9   9.9     Pop fossa AH 15.9  2.6  95.3 Pop fossa - Ankle 40 39 >=41 8.8             SNC    Nerve / Sites Rec. Site Peak Lat Ref.  Amp Ref. Segments Distance    ms ms V V  cm  R Sural - Ankle (Calf)     Calf Ankle NR <=4.4 NR >=6 Calf - Ankle 14  L Sural - Ankle (Calf)     Calf Ankle NR <=4.4 NR >=6 Calf - Ankle 14  R Superficial peroneal - Ankle     Lat leg Ankle NR <=4.4 NR >=6 Lat leg - Ankle 14  L Superficial peroneal - Ankle     Lat leg Ankle NR <=4.4 NR >=6 Lat leg - Ankle  67             F  Wave    Nerve F Lat Ref.   ms ms  R Tibial - AH  <=56.0  L Tibial - AH 76.8 <=56.0         EMG Summary Table    Spontaneous MUAP Recruitment  Muscle IA Fib PSW Fasc Other Amp Dur. Poly Pattern  R. Vastus medialis Normal None None None _______ Normal Normal Normal Normal  R. Tibialis anterior Normal 2+ 2+ None _______ Increased Normal Normal Discrete  R. Gastrocnemius (Medial head) Normal 1+ 1+ None _______ Increased Normal Normal Reduced

## 2024-01-12 ENCOUNTER — Encounter: Payer: Medicare Other | Admitting: Diagnostic Neuroimaging

## 2024-02-13 ENCOUNTER — Telehealth: Payer: Self-pay | Admitting: Diagnostic Neuroimaging

## 2024-02-13 NOTE — Telephone Encounter (Signed)
 Pt's wife, Gabrielle Mester scheduled an appointment for neuropathy. Just left Dr.  Mattie Marlin office' advised to set up an appointment on for an update on what patient should be taking for neuropathy. Ms. Xu asking for a call back.  Scheduled appt on 08/28/24 at 4:00 pm and added to the waitlist.

## 2024-02-14 NOTE — Telephone Encounter (Signed)
 Contacted pt back, I discussed Dr Richrd Humbles plan was when he saw him last in December.  I advised he should discuss medications with Dr Bertram Savin as he may want to adjust regimen, if symptoms are persistent. Dr Bertram Savin does manage this for pt. Pt also request his records be sent to Dr Bertram Savin, he does have a upcoming visit with him in March. Will have MR send labs, NCS and OV.  Pt verbally understood and was appreciative.

## 2024-02-17 ENCOUNTER — Ambulatory Visit: Payer: Self-pay | Admitting: Neurology

## 2024-08-28 ENCOUNTER — Ambulatory Visit: Payer: Medicare Other | Admitting: Diagnostic Neuroimaging

## 2024-11-30 ENCOUNTER — Ambulatory Visit: Admitting: Podiatry

## 2024-11-30 ENCOUNTER — Ambulatory Visit

## 2024-11-30 ENCOUNTER — Encounter: Payer: Self-pay | Admitting: Podiatry

## 2024-11-30 VITALS — Ht 73.0 in | Wt 158.0 lb

## 2024-11-30 DIAGNOSIS — M79671 Pain in right foot: Secondary | ICD-10-CM | POA: Diagnosis not present

## 2024-12-03 NOTE — Progress Notes (Signed)
 Subjective:   Patient ID: Gabriel Henderson, male   DOB: 78 y.o.   MRN: [id number]   HPI Patient presents on emergency stated that he dropped a heavy object on his foot 2 weeks ago and they are concerned about healing.  States he has swelling and minimal discomfort and is taking medication and ice.  Patient does smoke half a pack a cigarette today does not have significant activity   Review of Systems  All other systems reviewed and are negative.       Objective:  Physical Exam Vitals and nursing note reviewed.  Constitutional:      Appearance: He is well-developed.  Pulmonary:     Effort: Pulmonary effort is normal.  Musculoskeletal:        General: Normal range of motion.  Skin:    General: Skin is warm.  Neurological:     Mental Status: He is alert.     Neurovascular status was found to be intact muscle strength was adequate range of motion adequate negative Homans' sign noted mild edema in the dorsum of the foot localized no abrasions no breaks in skin no loss of function around the area or what appears to be nerve damage.  Patient has good digital perfusion well-oriented x 3     Assessment:  Patient appears to be doing well with trauma to the right foot but was concerned about continued edema     Plan:  Reviewed condition and x-ray discussed ice therapy wider shoe gear and should gradually get better may take 6 to 8 weeks.  Precautionary x-ray reviewed  X-rays indicate that there is no signs of fracture no signs of Lisfranc's dislocation with pathology of the midfoot
